# Patient Record
Sex: Female | Born: 1955 | Race: White | Hispanic: No | Marital: Married | Smoking: Former smoker
Health system: Southern US, Community
[De-identification: ages and names within clinical notes are randomized; demographics above are authoritative.]

## PROBLEM LIST (undated history)

## (undated) DIAGNOSIS — T8859XA Other complications of anesthesia, initial encounter: Secondary | ICD-10-CM

## (undated) DIAGNOSIS — M1711 Unilateral primary osteoarthritis, right knee: Secondary | ICD-10-CM

## (undated) DIAGNOSIS — J302 Other seasonal allergic rhinitis: Secondary | ICD-10-CM

## (undated) DIAGNOSIS — T4145XA Adverse effect of unspecified anesthetic, initial encounter: Secondary | ICD-10-CM

## (undated) DIAGNOSIS — J45909 Unspecified asthma, uncomplicated: Secondary | ICD-10-CM

## (undated) HISTORY — PX: TUBAL LIGATION: SHX77

## (undated) HISTORY — PX: OTHER SURGICAL HISTORY: SHX169

## (undated) HISTORY — PX: DIAGNOSTIC LAPAROSCOPY: SUR761

## (undated) HISTORY — PX: MULTIPLE TOOTH EXTRACTIONS: SHX2053

---

## 2002-01-22 HISTORY — PX: ESOPHAGOGASTRODUODENOSCOPY: SHX1529

## 2002-12-07 ENCOUNTER — Ambulatory Visit (HOSPITAL_COMMUNITY): Admission: RE | Admit: 2002-12-07 | Discharge: 2002-12-07 | Payer: Self-pay | Admitting: Family Medicine

## 2002-12-14 ENCOUNTER — Ambulatory Visit (HOSPITAL_COMMUNITY): Admission: RE | Admit: 2002-12-14 | Discharge: 2002-12-14 | Payer: Self-pay | Admitting: Internal Medicine

## 2002-12-22 ENCOUNTER — Ambulatory Visit (HOSPITAL_COMMUNITY): Admission: RE | Admit: 2002-12-22 | Discharge: 2002-12-22 | Payer: Self-pay | Admitting: Internal Medicine

## 2006-04-15 ENCOUNTER — Ambulatory Visit (HOSPITAL_COMMUNITY): Admission: RE | Admit: 2006-04-15 | Discharge: 2006-04-15 | Payer: Self-pay | Admitting: Family Medicine

## 2006-04-26 ENCOUNTER — Encounter: Admission: RE | Admit: 2006-04-26 | Discharge: 2006-04-26 | Payer: Self-pay | Admitting: Family Medicine

## 2007-02-19 ENCOUNTER — Ambulatory Visit (HOSPITAL_COMMUNITY): Admission: RE | Admit: 2007-02-19 | Discharge: 2007-02-19 | Payer: Self-pay | Admitting: Family Medicine

## 2007-12-04 ENCOUNTER — Ambulatory Visit (HOSPITAL_COMMUNITY): Admission: RE | Admit: 2007-12-04 | Discharge: 2007-12-04 | Payer: Self-pay | Admitting: Family Medicine

## 2008-02-17 ENCOUNTER — Emergency Department (HOSPITAL_COMMUNITY): Admission: EM | Admit: 2008-02-17 | Discharge: 2008-02-18 | Payer: Self-pay | Admitting: Emergency Medicine

## 2008-02-26 ENCOUNTER — Ambulatory Visit (HOSPITAL_COMMUNITY): Admission: RE | Admit: 2008-02-26 | Discharge: 2008-02-26 | Payer: Self-pay | Admitting: Family Medicine

## 2010-02-12 ENCOUNTER — Encounter: Payer: Self-pay | Admitting: Family Medicine

## 2010-06-09 NOTE — Procedures (Signed)
NAMETISH, BEGIN                ACCOUNT NO.:  [id number]   MEDICAL RECORD NO.:  [id number]          PATIENT TYPE:  OUT   LOCATION:  RESP                          FACILITY:  [facility]   PHYSICIAN:  Edward L. Juanetta Gosling, M.D.DATE OF BIRTH:  27-Mar-1955   DATE OF PROCEDURE:  DATE OF DISCHARGE:                            PULMONARY FUNCTION TEST   PULMONARY FUNCTION TEST:  1. Spirometry shows no ventilatory defect in the volumes and      capacities are actually supranormal.  There is probably some      airflow obstruction at the level of the smaller airways.  2. There is no bronchodilator improvement.      Edward L. Juanetta Gosling, M.D.  Electronically Signed     ELH/MEDQ  D:  02/27/2008  T:  02/27/2008  Job:  [id number]   cc:   Lorin Picket A. Gerda Diss, MD  Fax: [phone]

## 2010-06-09 NOTE — Op Note (Signed)
NAME:  Danielle Spears, Danielle Spears                           ACCOUNT NO.:  [id number]   MEDICAL RECORD NO.:  [id number]                   PATIENT TYPE:  AMB   LOCATION:  DAY                                  FACILITY:  [facility]   PHYSICIAN:  Lionel December, M.D.                 DATE OF BIRTH:  19-Mar-1955   DATE OF PROCEDURE:  12/14/2002  DATE OF DISCHARGE:                                 OPERATIVE REPORT   PROCEDURE:  Esophagogastroduodenoscopy with esophageal dilatation.   ENDOSCOPIST:  Lionel December, M.D.   INDICATIONS:  This patient is a 55 year old Caucasian female who presents  with recent onset of intractable heartburn and dysphagia as well as some  odynophagia. She has been on Prevacid, this is her second week, but does not  feel much better.  There is no history of recent antibiotic use.  The  procedure and risks were reviewed with the patient and informed consent was  obtained.   PREOPERATIVE MEDICATIONS:  Cetacaine spray for oropharyngeal topical  anesthesia, Demerol 50 mg IV and Versed 6 mg IV in divided dose.   FINDINGS:  Procedure performed in endoscopy suite.  The patient's vital  signs and O2 saturation were monitored during the procedure and remained  stable.  The patient was placed in the left lateral recumbent position and  Olympus videoscope was passed via the oropharynx without any difficulty into  the esophagus.   ESOPHAGUS:  Mucosa of the esophagus was normal throughout.  Squamocolumnar  junction was somewhat wavy but there was no ring or stricture noted, so did  not see any hernia.   STOMACH:  It was empty and distended very well with insufflation.  The folds  of the proximal stomach were normal.  Examination of the mucosa revealed  patchy red areas at the body.  The mucosa of the pyloric channel was covered  with blood; however, there was no ulcer underneath.  The pyloric channel was  patent.  The angularis, fundus, and cardia were examined by retroflexing the  scope and  were normal.   DUODENUM:  Examination of the bulb and second part of the duodenum was  normal.  The endoscope was withdrawn.   The esophagus dilated by passing 56 French Maloney dilator through the  esophagus completely.  As the dilatator was withdrawn, the endoscope was  passed again; and there was no mucosal injury noted to esophagus.   The endoscope was withdrawn.  The patient tolerated the procedure well.   FINAL DIAGNOSES:  1. No endoscopic evidence of reflux esophagitis, stricture or ring     formation.  2. Esophagus dilated by passing 56 French Maloney dilator given history of     dysphagia.  3. Gastritic and pyloric channel inflammation.   RECOMMENDATIONS:  1. She will continue antireflux measures on a PPI.  2. H. pylori serology will be checked today.  If she remains with dysphagia  will bring her back for barium study with a pill prior to considering     esophageal manometry.      ___________________________________________                                            Lionel December, M.D.   NR/MEDQ  D:  12/14/2002  T:  12/14/2002  Job:  [id number]   cc:   Lorin Picket A. Gerda Diss, M.D.  [address]  [place]  [place] [address]  Fax: [phone]

## 2012-02-14 ENCOUNTER — Telehealth: Payer: Self-pay | Admitting: Urgent Care

## 2012-02-14 NOTE — Telephone Encounter (Signed)
Pt called this afternoon. She said that her PCP (Luking) told her to call to set up her tcs. Please call her at [phone]

## 2012-02-15 NOTE — Telephone Encounter (Signed)
LMOM to call.

## 2012-02-19 NOTE — Telephone Encounter (Signed)
LMOM to call.

## 2012-02-25 ENCOUNTER — Other Ambulatory Visit: Payer: Self-pay

## 2012-02-25 ENCOUNTER — Other Ambulatory Visit: Payer: Self-pay | Admitting: Family Medicine

## 2012-02-25 ENCOUNTER — Telehealth: Payer: Self-pay

## 2012-02-25 ENCOUNTER — Encounter (HOSPITAL_COMMUNITY): Payer: Self-pay | Admitting: Pharmacy Technician

## 2012-02-25 DIAGNOSIS — Z139 Encounter for screening, unspecified: Secondary | ICD-10-CM

## 2012-02-25 MED ORDER — PEG-KCL-NACL-NASULF-NA ASC-C 100 G PO SOLR: 1.0000 | ORAL | Status: DC

## 2012-02-25 NOTE — Telephone Encounter (Signed)
Gastroenterology Pre-Procedure Form    Request Date: 02/25/2012    Requesting Physician: Dr. Lilyan Punt     PATIENT INFORMATION:  Danielle Spears is a 57 y.o., female (DOB=March 31, 1955).  PROCEDURE: Procedure(s) requested: colonoscopy Procedure Reason: screening for colon cancer  PATIENT REVIEW QUESTIONS: The patient reports the following:   1. Diabetes Melitis: no 2. Joint replacements in the past 12 months: no 3. Major health problems in the past 3 months: no 4. Has an artificial valve or MVP:no 5. Has been advised in past to take antibiotics in advance of a procedure like teeth cleaning: no}    MEDICATIONS & ALLERGIES:    Patient reports the following regarding taking any blood thinners:   Plavix? no Aspirin?no Coumadin?  no  Patient confirms/reports the following medications:  Current Outpatient Prescriptions  Medication Sig Dispense Refill  . cetirizine (ZYRTEC) 10 MG tablet Take 10 mg by mouth daily.      . Fluticasone-Salmeterol (ADVAIR) 250-50 MCG/DOSE AEPB Inhale 1 puff into the lungs every 12 (twelve) hours.        Patient confirms/reports the following allergies:  No Known Allergies  Patient is appropriate to schedule for requested procedure(s): yes  AUTHORIZATION INFORMATION Primary Insurance:  ID #: Group #:  Pre-Cert / Auth required:  Pre-Cert / Auth #:   Secondary Insurance:   ID #:  Group #:  Pre-Cert / Auth required:  Pre-Cert / Auth #:   No orders of the defined types were placed in this encounter.    SCHEDULE INFORMATION: Procedure has been scheduled as follows:  Date: 03/03/2012    Time: 11:30 AM  Location: [facility] Short Stay  This Gastroenterology Pre-Precedure Form is being routed to the following provider(s) for review: R. Roetta Sessions, MD

## 2012-02-25 NOTE — Telephone Encounter (Signed)
Returned pt's call to schedule screening colonoscopy. LMOM to call.

## 2012-02-25 NOTE — Telephone Encounter (Signed)
See separate triage.  

## 2012-02-25 NOTE — Telephone Encounter (Signed)
Rx sent to [facility] in [place]. Instructions mailed to pt.

## 2012-02-25 NOTE — Telephone Encounter (Signed)
Appropriate to schedule.  

## 2012-02-27 ENCOUNTER — Telehealth: Payer: Self-pay

## 2012-02-27 NOTE — Telephone Encounter (Signed)
I called [employer] at [phone] and spoke to Darlisa C, who said that a PA is not required for a screening colonoscopy.

## 2012-03-03 ENCOUNTER — Ambulatory Visit (HOSPITAL_COMMUNITY)
Admission: RE | Admit: 2012-03-03 | Discharge: 2012-03-03 | Disposition: A | Discharge status: Home / Self Care | Payer: Managed Care, Other (non HMO) | Source: Ambulatory Visit | Attending: Internal Medicine | Admitting: Internal Medicine

## 2012-03-03 ENCOUNTER — Encounter (HOSPITAL_COMMUNITY)
Admission: RE | Disposition: A | Discharge status: Home / Self Care | Payer: Self-pay | Source: Ambulatory Visit | Attending: Internal Medicine

## 2012-03-03 ENCOUNTER — Encounter (HOSPITAL_COMMUNITY): Payer: Self-pay | Admitting: *Deleted

## 2012-03-03 DIAGNOSIS — Z1211 Encounter for screening for malignant neoplasm of colon: Secondary | ICD-10-CM

## 2012-03-03 DIAGNOSIS — Z139 Encounter for screening, unspecified: Secondary | ICD-10-CM

## 2012-03-03 DIAGNOSIS — K6389 Other specified diseases of intestine: Secondary | ICD-10-CM

## 2012-03-03 HISTORY — PX: COLONOSCOPY: SHX5424

## 2012-03-03 SURGERY — COLONOSCOPY
Anesthesia: Moderate Sedation

## 2012-03-03 MED ORDER — MEPERIDINE HCL 100 MG/ML IJ SOLN
INTRAMUSCULAR | Status: DC | PRN
  Administered 2012-03-03: 25 mg via INTRAVENOUS
  Administered 2012-03-03: 50 mg via INTRAVENOUS
  Administered 2012-03-03: 25 mg via INTRAVENOUS

## 2012-03-03 MED ORDER — ONDANSETRON HCL 4 MG/2ML IJ SOLN
INTRAMUSCULAR | Status: AC
  Filled 2012-03-03: qty 2

## 2012-03-03 MED ORDER — STERILE WATER FOR IRRIGATION IR SOLN
Status: DC | PRN
  Administered 2012-03-03: 11:00:00

## 2012-03-03 MED ORDER — MIDAZOLAM HCL 5 MG/5ML IJ SOLN
INTRAMUSCULAR | Status: AC
  Filled 2012-03-03: qty 10

## 2012-03-03 MED ORDER — MEPERIDINE HCL 100 MG/ML IJ SOLN
INTRAMUSCULAR | Status: AC
  Filled 2012-03-03: qty 1

## 2012-03-03 MED ORDER — SODIUM CHLORIDE 0.45 % IV SOLN
INTRAVENOUS | Status: DC
  Administered 2012-03-03: 1000 mL via INTRAVENOUS

## 2012-03-03 MED ORDER — MIDAZOLAM HCL 5 MG/5ML IJ SOLN
INTRAMUSCULAR | Status: DC | PRN
  Administered 2012-03-03: 1 mg via INTRAVENOUS
  Administered 2012-03-03 (×2): 2 mg via INTRAVENOUS

## 2012-03-03 MED ORDER — ONDANSETRON HCL 4 MG/2ML IJ SOLN
INTRAMUSCULAR | Status: DC | PRN
  Administered 2012-03-03: 4 mg via INTRAVENOUS

## 2012-03-03 NOTE — H&P (Signed)
  Primary Care Physician:  Lilyan Punt, MD Primary Gastroenterologist:  Dr. Jena Gauss  Pre-Procedure History & Physical: HPI:  Danielle Spears is a 57 y.o. female is here for a screening colonoscopy. First-ever examination. No bowel symptoms. No family history colon cancer.  History reviewed. No pertinent past medical history.  Past Surgical History  Procedure Laterality Date  . Cesarean section      Prior to Admission medications   Medication Sig Start Date End Date Taking? Authorizing Provider  cetirizine (ZYRTEC) 10 MG tablet Take 10 mg by mouth daily.   Yes Historical Provider, MD  Fluticasone-Salmeterol (ADVAIR) 250-50 MCG/DOSE AEPB Inhale 1 puff into the lungs every 12 (twelve) hours.   Yes Historical Provider, MD  peg 3350 powder (MOVIPREP) 100 G SOLR Take 1 kit (100 g total) by mouth as directed. 02/25/12  Yes Corbin Ade, MD    Allergies as of 02/25/2012  . (No Known Allergies)    History reviewed. No pertinent family history.  History   Social History  . Marital Status: Married    Spouse Name: N/A    Number of Children: N/A  . Years of Education: N/A   Occupational History  . Not on file.   Social History Main Topics  . Smoking status: Former Games developer  . Smokeless tobacco: Not on file  . Alcohol Use: Not on file  . Drug Use: Not on file  . Sexually Active: Not on file   Other Topics Concern  . Not on file   Social History Narrative  . No narrative on file    Review of Systems: See HPI, otherwise negative ROS  Physical Exam: BP 136/55  Pulse 54  Temp(Src) 97.5 F (36.4 C) (Oral)  Resp 19  Ht 5' 2.5" (1.588 m)  Wt 170 lb (77.111 kg)  BMI 30.58 kg/m2  SpO2 100% General:   Alert,  Well-developed, well-nourished, pleasant and cooperative in NAD Head:  Normocephalic and atraumatic. Eyes:  Sclera clear, no icterus.   Conjunctiva pink. Ears:  Normal auditory acuity. Nose:  No deformity, discharge,  or lesions. Mouth:  No deformity or lesions,  dentition normal. Neck:  Supple; no masses or thyromegaly. Lungs:  Clear throughout to auscultation.   No wheezes, crackles, or rhonchi. No acute distress. Heart:  Regular rate and rhythm; no murmurs, clicks, rubs,  or gallops. Abdomen:  Soft, nontender and nondistended. No masses, hepatosplenomegaly or hernias noted. Normal bowel sounds, without guarding, and without rebound.   Msk:  Symmetrical without gross deformities. Normal posture. Pulses:  Normal pulses noted. Extremities:  Without clubbing or edema. Neurologic:  Alert and  oriented x4;  grossly normal neurologically. Skin:  Intact without significant lesions or rashes. Cervical Nodes:  No significant cervical adenopathy. Psych:  Alert and cooperative. Normal mood and affect.  Impression/Plan: Danielle Spears is now here to undergo a screening colonoscopy.  Average risk screening examination. Risks, benefits, limitations, imponderables and alternatives regarding colonoscopy have been reviewed with the patient. Questions have been answered. All parties agreeable.

## 2012-03-03 NOTE — Op Note (Signed)
[facility] [address] [place], [address]   COLONOSCOPY PROCEDURE REPORT  PATIENT: Danielle, Spears  MR#:         [id number] BIRTHDATE: 1955/03/20 , 56  yrs. old GENDER: Female ENDOSCOPIST: R.  Roetta Sessions, MD FACP FACG REFERRED BY:  Lilyan Punt, M.D. PROCEDURE DATE:  03/03/2012 PROCEDURE:     First ever screening colonoscopy  INDICATIONS: Average risk colorectal cancer screening  INFORMED CONSENT:  The risks, benefits, alternatives and imponderables including but not limited to bleeding, perforation as well as the possibility of a missed lesion have been reviewed.  The potential for biopsy, lesion removal, etc. have also been discussed.  Questions have been answered.  All parties agreeable. Please see the history and physical in the medical record for more information.  MEDICATIONS: Versed 5 mg IV and Demerol 100 mg IV in divided doses. Zofran 4 mg IV  DESCRIPTION OF PROCEDURE:  After a digital rectal exam was performed, the Pentax Colonoscope [id number]  colonoscope was advanced from the anus through the rectum and colon to the area of the cecum, ileocecal valve and appendiceal orifice.  The cecum was deeply intubated.  These structures were well-seen and photographed for the record.  From the level of the cecum and ileocecal valve, the scope was slowly and cautiously withdrawn.  The mucosal surfaces were carefully surveyed utilizing scope tip deflection to facilitate fold flattening as needed.  The scope was pulled down into the rectum where a thorough examination including retroflexion was performed.    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 Otherwise, the rectal mucosa appeared normal. Likewise, diffusely pigmented colonic mucosa consistent with melanosis  coli; however, the colonic mucosa otherwise appeared normal. The distal 10 cm of terminal ileal mucosa also appeared  normal  THERAPEUTIC / DIAGNOSTIC MANEUVERS PERFORMED:  none  COMPLICATIONS: none  CECAL WITHDRAWAL TIME:  9 minutes  IMPRESSION:  Melanosis coli  RECOMMENDATIONS: Repeat screening colonoscopy in 10 years   _______________________________ eSigned:  R. Roetta Sessions, MD FACP [facility] 03/03/2012 11:34 AM   CC:    PATIENT NAME:  Danielle, Spears MR#: [id number]

## 2012-03-04 ENCOUNTER — Ambulatory Visit (HOSPITAL_COMMUNITY)
Admission: RE | Admit: 2012-03-04 | Discharge: 2012-03-04 | Disposition: A | Discharge status: Home / Self Care | Payer: Managed Care, Other (non HMO) | Source: Ambulatory Visit | Attending: Family Medicine | Admitting: Family Medicine

## 2012-03-04 DIAGNOSIS — Z1231 Encounter for screening mammogram for malignant neoplasm of breast: Secondary | ICD-10-CM | POA: Insufficient documentation

## 2012-03-04 DIAGNOSIS — Z139 Encounter for screening, unspecified: Secondary | ICD-10-CM

## 2012-03-06 ENCOUNTER — Encounter (HOSPITAL_COMMUNITY): Payer: Self-pay | Admitting: Internal Medicine

## 2012-04-19 ENCOUNTER — Emergency Department (HOSPITAL_COMMUNITY): Payer: Managed Care, Other (non HMO)

## 2012-04-19 ENCOUNTER — Emergency Department (HOSPITAL_COMMUNITY)
Admission: EM | Admit: 2012-04-19 | Discharge: 2012-04-19 | Disposition: A | Discharge status: Home / Self Care | Payer: Managed Care, Other (non HMO) | Attending: Emergency Medicine | Admitting: Emergency Medicine

## 2012-04-19 ENCOUNTER — Encounter (HOSPITAL_COMMUNITY): Payer: Self-pay | Admitting: *Deleted

## 2012-04-19 DIAGNOSIS — S83146A Lateral dislocation of proximal end of tibia, unspecified knee, initial encounter: Secondary | ICD-10-CM | POA: Insufficient documentation

## 2012-04-19 DIAGNOSIS — J45909 Unspecified asthma, uncomplicated: Secondary | ICD-10-CM | POA: Insufficient documentation

## 2012-04-19 DIAGNOSIS — Z87891 Personal history of nicotine dependence: Secondary | ICD-10-CM | POA: Insufficient documentation

## 2012-04-19 DIAGNOSIS — Y929 Unspecified place or not applicable: Secondary | ICD-10-CM | POA: Insufficient documentation

## 2012-04-19 DIAGNOSIS — Y939 Activity, unspecified: Secondary | ICD-10-CM | POA: Insufficient documentation

## 2012-04-19 DIAGNOSIS — X58XXXA Exposure to other specified factors, initial encounter: Secondary | ICD-10-CM | POA: Insufficient documentation

## 2012-04-19 DIAGNOSIS — S83101A Unspecified subluxation of right knee, initial encounter: Secondary | ICD-10-CM

## 2012-04-19 DIAGNOSIS — IMO0002 Reserved for concepts with insufficient information to code with codable children: Secondary | ICD-10-CM | POA: Insufficient documentation

## 2012-04-19 HISTORY — DX: Unspecified asthma, uncomplicated: J45.909

## 2012-04-19 MED ORDER — OXYCODONE-ACETAMINOPHEN 5-325 MG PO TABS: 1.0000 | ORAL_TABLET | ORAL | Status: DC | PRN

## 2012-04-19 MED ORDER — OXYCODONE-ACETAMINOPHEN 5-325 MG PO TABS
1.0000 | ORAL_TABLET | Freq: Once | ORAL | Status: AC
  Administered 2012-04-19: 1 via ORAL
  Filled 2012-04-19: qty 1

## 2012-04-19 NOTE — ED Provider Notes (Signed)
History     CSN: [id number]  Arrival date & time 04/19/12  1624   First MD Initiated Contact with Patient 04/19/12 1702      Chief Complaint  Patient presents with  . Knee Pain    (Consider location/radiation/quality/duration/timing/severity/associated sxs/prior treatment) HPI Danielle Spears is a 57 y.o. female who presents to the ED with knee pain. The pain is located in the right knee in the medial aspect. The pain is worse with weight bearing. Nothing makes the pain better. The pain started last night. The onset was sudden. Associated symptoms include swelling. There was no known injury. The history was provided by the patient.  Past Medical History  Diagnosis Date  . Asthma     Past Surgical History  Procedure Laterality Date  . Cesarean section    . Colonoscopy N/A 03/03/2012    Procedure: COLONOSCOPY;  Surgeon: Corbin Ade, MD;  Location: [facility] ENDO SUITE;  Service: Endoscopy;  Laterality: N/A;  11:30 AM    No family history on file.  History  Substance Use Topics  . Smoking status: Former Games developer  . Smokeless tobacco: Not on file  . Alcohol Use: Not on file    OB History   Grav Para Term Preterm Abortions TAB SAB Ect Mult Living                  Review of Systems  Constitutional: Negative for activity change.  Respiratory: Negative for chest tightness.   Cardiovascular: Negative for chest pain.  Gastrointestinal: Negative for nausea and vomiting.  Musculoskeletal: Negative for back pain.       Right knee pain  Skin: Negative for wound.  Allergic/Immunologic: Negative for immunocompromised state.  Neurological: Negative for dizziness and headaches.  Psychiatric/Behavioral: Negative for confusion. The patient is not nervous/anxious.     Allergies  Review of patient's allergies indicates no known allergies.  Home Medications   Current Outpatient Rx  Name  Route  Sig  Dispense  Refill  . Fluticasone-Salmeterol (ADVAIR) 250-50 MCG/DOSE AEPB  Inhalation   Inhale 1 puff into the lungs every 12 (twelve) hours.           BP 129/82  Pulse 52  Temp(Src) 98.3 F (36.8 C) (Oral)  Resp 20  Ht 5\' 2"  (1.575 m)  Wt 160 lb (72.576 kg)  BMI 29.26 kg/m2  SpO2 100%  Physical Exam  Nursing note and vitals reviewed. Constitutional: She is oriented to person, place, and time. She appears well-developed and well-nourished. No distress.  HENT:  Head: Normocephalic and atraumatic.  Eyes: EOM are normal. Pupils are equal, round, and reactive to light.  Neck: Neck supple.  Cardiovascular: Normal rate.   Pulmonary/Chest: Effort normal.  Musculoskeletal: She exhibits no edema.       Right knee: She exhibits decreased range of motion and swelling. She exhibits no ecchymosis, no erythema and normal patellar mobility. Tenderness found. MCL tenderness noted.  Neurological: She is alert and oriented to person, place, and time. She has normal strength. No cranial nerve deficit or sensory deficit.  Pedal pulses equal bilateral. Adequate circulation. Good touch sensation.  Skin: Skin is warm and dry.  Psychiatric: She has a normal mood and affect. Her behavior is normal. Judgment and thought content normal.    ED Course  Procedures (including critical care time)  Labs Reviewed - No data to display Dg Knee Complete 4 Views Right  04/19/2012  *RADIOLOGY REPORT*  Clinical Data: Right knee pain around kneecap.  RIGHT  KNEE - COMPLETE 4+ VIEW  Comparison: None.  Findings: Four views of the knee demonstrate chondrocalcinosis. There is a small suprapatellar joint effusion.  There appears to be lateral subluxation of the knee joint.  Mild degenerative changes involving the medial and lateral knee compartment. No evidence for an acute fracture.  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  Small suprapatellar joint effusion.   Original Report Authenticated By: Richarda Overlie, M.D.    Assessment: 57 y.o. female with  right knee pain   DJD, small suprapatellar joint effusion, some lateral subluxation  Plan:  Consult with ortho Dr. Luiz Blare   Knee immobilizer   Pain management    Follow up in the ortho office    MDM  Dr. Juleen China in to examine the patient and spoke with Dr. Luiz Blare regarding the x-ray findings. Patient should be stable to follow up in the office. She will be placed in a knee immobilizer and crutches prior to discharge. Dr. Juleen China discussed plan of care with the patient and she voices understanding.      Medication List    TAKE these medications       oxyCODONE-acetaminophen 5-325 MG per tablet  Commonly known as:  PERCOCET/ROXICET  Take 1 tablet by mouth every 4 (four) hours as needed for pain.      ASK your doctor about these medications       Fluticasone-Salmeterol 250-50 MCG/DOSE Aepb  Commonly known as:  ADVAIR  Inhale 1 puff into the lungs every 12 (twelve) hours.             [facility] Orlene Och, [place] 04/19/12 1859

## 2012-04-19 NOTE — ED Notes (Signed)
Pt c/o right knee pain that started last night, swelling noted to right knee, denies any injury,

## 2012-04-23 NOTE — ED Provider Notes (Signed)
Medical screening examination/treatment/procedure(s) were performed by non-physician practitioner and as supervising physician I was immediately available for consultation/collaboration.  Raeford Razor, MD 04/23/12 1919

## 2012-04-30 ENCOUNTER — Other Ambulatory Visit: Payer: Self-pay | Admitting: Orthopedic Surgery

## 2012-04-30 ENCOUNTER — Encounter (HOSPITAL_BASED_OUTPATIENT_CLINIC_OR_DEPARTMENT_OTHER): Payer: Self-pay | Admitting: *Deleted

## 2012-04-30 NOTE — Progress Notes (Signed)
asthma controlled-no other health issues

## 2012-05-02 ENCOUNTER — Encounter (HOSPITAL_BASED_OUTPATIENT_CLINIC_OR_DEPARTMENT_OTHER)
Admission: RE | Disposition: A | Discharge status: Home / Self Care | Payer: Self-pay | Source: Ambulatory Visit | Attending: Orthopedic Surgery

## 2012-05-02 ENCOUNTER — Ambulatory Visit (HOSPITAL_BASED_OUTPATIENT_CLINIC_OR_DEPARTMENT_OTHER): Payer: Managed Care, Other (non HMO) | Admitting: Certified Registered Nurse Anesthetist

## 2012-05-02 ENCOUNTER — Encounter (HOSPITAL_BASED_OUTPATIENT_CLINIC_OR_DEPARTMENT_OTHER): Payer: Self-pay | Admitting: *Deleted

## 2012-05-02 ENCOUNTER — Ambulatory Visit (HOSPITAL_BASED_OUTPATIENT_CLINIC_OR_DEPARTMENT_OTHER)
Admission: RE | Admit: 2012-05-02 | Discharge: 2012-05-02 | Disposition: A | Discharge status: Home / Self Care | Payer: Managed Care, Other (non HMO) | Source: Ambulatory Visit | Attending: Orthopedic Surgery | Admitting: Orthopedic Surgery

## 2012-05-02 ENCOUNTER — Encounter (HOSPITAL_BASED_OUTPATIENT_CLINIC_OR_DEPARTMENT_OTHER): Payer: Self-pay | Admitting: Certified Registered Nurse Anesthetist

## 2012-05-02 DIAGNOSIS — Z87891 Personal history of nicotine dependence: Secondary | ICD-10-CM | POA: Insufficient documentation

## 2012-05-02 DIAGNOSIS — X58XXXA Exposure to other specified factors, initial encounter: Secondary | ICD-10-CM | POA: Insufficient documentation

## 2012-05-02 DIAGNOSIS — J45909 Unspecified asthma, uncomplicated: Secondary | ICD-10-CM | POA: Insufficient documentation

## 2012-05-02 DIAGNOSIS — S83289A Other tear of lateral meniscus, current injury, unspecified knee, initial encounter: Secondary | ICD-10-CM | POA: Insufficient documentation

## 2012-05-02 DIAGNOSIS — Z79899 Other long term (current) drug therapy: Secondary | ICD-10-CM | POA: Insufficient documentation

## 2012-05-02 DIAGNOSIS — M2240 Chondromalacia patellae, unspecified knee: Secondary | ICD-10-CM | POA: Insufficient documentation

## 2012-05-02 DIAGNOSIS — M9420 Chondromalacia, unspecified site: Secondary | ICD-10-CM | POA: Insufficient documentation

## 2012-05-02 DIAGNOSIS — IMO0002 Reserved for concepts with insufficient information to code with codable children: Secondary | ICD-10-CM | POA: Insufficient documentation

## 2012-05-02 HISTORY — DX: Other seasonal allergic rhinitis: J30.2

## 2012-05-02 HISTORY — PX: KNEE ARTHROSCOPY WITH MEDIAL MENISECTOMY: SHX5651

## 2012-05-02 SURGERY — ARTHROSCOPY, KNEE, WITH MEDIAL MENISCECTOMY
Anesthesia: General | Site: Knee | Laterality: Right | Wound class: Clean

## 2012-05-02 MED ORDER — EPINEPHRINE HCL 1 MG/ML IJ SOLN
INTRAMUSCULAR | Status: DC | PRN
  Administered 2012-05-02: 1 mg

## 2012-05-02 MED ORDER — DEXAMETHASONE SODIUM PHOSPHATE 4 MG/ML IJ SOLN
INTRAMUSCULAR | Status: DC | PRN
  Administered 2012-05-02: 4 mg via INTRAVENOUS

## 2012-05-02 MED ORDER — ATROPINE SULFATE 0.4 MG/ML IJ SOLN
INTRAMUSCULAR | Status: DC | PRN
  Administered 2012-05-02 (×2): .1 mg via INTRAVENOUS

## 2012-05-02 MED ORDER — LACTATED RINGERS IV SOLN
INTRAVENOUS | Status: DC
  Administered 2012-05-02 (×2): via INTRAVENOUS

## 2012-05-02 MED ORDER — ONDANSETRON HCL 4 MG/2ML IJ SOLN
INTRAMUSCULAR | Status: DC | PRN
  Administered 2012-05-02: 4 mg via INTRAVENOUS

## 2012-05-02 MED ORDER — OXYCODONE-ACETAMINOPHEN 5-325 MG PO TABS: 1.0000 | ORAL_TABLET | Freq: Four times a day (QID) | ORAL | Status: DC | PRN

## 2012-05-02 MED ORDER — POVIDONE-IODINE 7.5 % EX SOLN: Freq: Once | CUTANEOUS | Status: DC

## 2012-05-02 MED ORDER — SODIUM CHLORIDE 0.9 % IR SOLN
Status: DC | PRN
  Administered 2012-05-02: 5000 mL

## 2012-05-02 MED ORDER — LIDOCAINE HCL (CARDIAC) 20 MG/ML IV SOLN
INTRAVENOUS | Status: DC | PRN
  Administered 2012-05-02: 80 mg via INTRAVENOUS

## 2012-05-02 MED ORDER — FENTANYL CITRATE 0.05 MG/ML IJ SOLN
INTRAMUSCULAR | Status: DC | PRN
  Administered 2012-05-02: 25 ug via INTRAVENOUS
  Administered 2012-05-02: 100 ug via INTRAVENOUS
  Administered 2012-05-02: 50 ug via INTRAVENOUS
  Administered 2012-05-02: 25 ug via INTRAVENOUS

## 2012-05-02 MED ORDER — BUPIVACAINE HCL (PF) 0.5 % IJ SOLN
INTRAMUSCULAR | Status: DC | PRN
  Administered 2012-05-02: 20 mL via INTRA_ARTICULAR

## 2012-05-02 MED ORDER — HYDROMORPHONE HCL PF 1 MG/ML IJ SOLN
0.2500 mg | INTRAMUSCULAR | Status: DC | PRN
  Administered 2012-05-02 (×4): 0.5 mg via INTRAVENOUS

## 2012-05-02 MED ORDER — OXYCODONE HCL 5 MG PO TABS
5.0000 mg | ORAL_TABLET | Freq: Once | ORAL | Status: AC | PRN
  Administered 2012-05-02: 5 mg via ORAL

## 2012-05-02 MED ORDER — MIDAZOLAM HCL 5 MG/5ML IJ SOLN
INTRAMUSCULAR | Status: DC | PRN
  Administered 2012-05-02: 2 mg via INTRAVENOUS

## 2012-05-02 MED ORDER — FENTANYL CITRATE 0.05 MG/ML IJ SOLN: 25.0000 ug | INTRAMUSCULAR | Status: DC | PRN

## 2012-05-02 MED ORDER — PROPOFOL 10 MG/ML IV BOLUS
INTRAVENOUS | Status: DC | PRN
  Administered 2012-05-02: 130 mg via INTRAVENOUS
  Administered 2012-05-02: 50 mg via INTRAVENOUS
  Administered 2012-05-02: 70 mg via INTRAVENOUS

## 2012-05-02 MED ORDER — CEFAZOLIN SODIUM-DEXTROSE 2-3 GM-% IV SOLR
2.0000 g | INTRAVENOUS | Status: AC
  Administered 2012-05-02: 2 g via INTRAVENOUS

## 2012-05-02 MED ORDER — FENTANYL CITRATE 0.05 MG/ML IJ SOLN: 50.0000 ug | INTRAMUSCULAR | Status: DC | PRN

## 2012-05-02 MED ORDER — OXYCODONE HCL 5 MG/5ML PO SOLN: 5.0000 mg | Freq: Once | ORAL | Status: AC | PRN

## 2012-05-02 MED ORDER — ONDANSETRON HCL 4 MG/2ML IJ SOLN: 4.0000 mg | Freq: Four times a day (QID) | INTRAMUSCULAR | Status: DC | PRN

## 2012-05-02 MED ORDER — MIDAZOLAM HCL 2 MG/2ML IJ SOLN: 1.0000 mg | INTRAMUSCULAR | Status: DC | PRN

## 2012-05-02 SURGICAL SUPPLY — 39 items
BANDAGE ELASTIC 6 VELCRO ST LF (GAUZE/BANDAGES/DRESSINGS) ×2 IMPLANT
BLADE 4.2CUDA (BLADE) IMPLANT
BLADE GREAT WHITE 4.2 (BLADE) ×2 IMPLANT
CANISTER OMNI JUG 16 LITER (MISCELLANEOUS) IMPLANT
CANISTER SUCTION 2500CC (MISCELLANEOUS) ×2 IMPLANT
CLOTH BEACON ORANGE TIMEOUT ST (SAFETY) ×2 IMPLANT
CUTTER MENISCUS  4.2MM (BLADE)
CUTTER MENISCUS 4.2MM (BLADE) IMPLANT
DRAPE ARTHROSCOPY W/POUCH 114 (DRAPES) ×2 IMPLANT
DRSG EMULSION OIL 3X3 NADH (GAUZE/BANDAGES/DRESSINGS) ×2 IMPLANT
DURAPREP 26ML APPLICATOR (WOUND CARE) ×2 IMPLANT
ELECT MENISCUS 165MM 90D (ELECTRODE) ×2 IMPLANT
ELECT REM PT RETURN 9FT ADLT (ELECTROSURGICAL) ×2
ELECTRODE REM PT RTRN 9FT ADLT (ELECTROSURGICAL) ×1 IMPLANT
GLOVE BIO SURGEON STRL SZ7 (GLOVE) ×2 IMPLANT
GLOVE BIOGEL PI IND STRL 8 (GLOVE) ×2 IMPLANT
GLOVE BIOGEL PI INDICATOR 8 (GLOVE) ×2
GLOVE ECLIPSE 7.5 STRL STRAW (GLOVE) ×6 IMPLANT
GOWN BRE IMP PREV XXLGXLNG (GOWN DISPOSABLE) ×2 IMPLANT
GOWN PREVENTION PLUS XLARGE (GOWN DISPOSABLE) ×4 IMPLANT
GOWN PREVENTION PLUS XXLARGE (GOWN DISPOSABLE) ×2 IMPLANT
HOLDER KNEE FOAM BLUE (MISCELLANEOUS) ×2 IMPLANT
IV NS IRRIG 3000ML ARTHROMATIC (IV SOLUTION) ×4 IMPLANT
KNEE WRAP E Z 3 GEL PACK (MISCELLANEOUS) ×2 IMPLANT
NDL SAFETY ECLIPSE 18X1.5 (NEEDLE) IMPLANT
NEEDLE HYPO 18GX1.5 SHARP (NEEDLE)
PACK ARTHROSCOPY DSU (CUSTOM PROCEDURE TRAY) ×2 IMPLANT
PACK BASIN DAY SURGERY FS (CUSTOM PROCEDURE TRAY) ×2 IMPLANT
PAD CAST 4YDX4 CTTN HI CHSV (CAST SUPPLIES) ×1 IMPLANT
PADDING CAST COTTON 4X4 STRL (CAST SUPPLIES) ×1
PENCIL BUTTON HOLSTER BLD 10FT (ELECTRODE) ×2 IMPLANT
SET ARTHROSCOPY TUBING (MISCELLANEOUS) ×1
SET ARTHROSCOPY TUBING LN (MISCELLANEOUS) ×1 IMPLANT
SPONGE GAUZE 4X4 12PLY (GAUZE/BANDAGES/DRESSINGS) ×2 IMPLANT
SUT ETHILON 4 0 PS 2 18 (SUTURE) IMPLANT
SYR 5ML LL (SYRINGE) ×2 IMPLANT
TOWEL OR 17X24 6PK STRL BLUE (TOWEL DISPOSABLE) ×2 IMPLANT
TOWEL OR NON WOVEN STRL DISP B (DISPOSABLE) ×2 IMPLANT
WATER STERILE IRR 1000ML POUR (IV SOLUTION) ×2 IMPLANT

## 2012-05-02 NOTE — Anesthesia Postprocedure Evaluation (Signed)
Anesthesia Post Note  Patient: Danielle Spears  Procedure(s) Performed: Procedure(s) (LRB): KNEE ARTHROSCOPY WITH PARTIAL MEDIAL AND LATERAL MENISECTOMY AND PATELLA-FEMORAL  CHONDROPLASTY (Right)  Anesthesia type: General  Patient location: PACU  Post pain: Pain level controlled and Adequate analgesia  Post assessment: Post-op Vital signs reviewed, Patient's Cardiovascular Status Stable, Respiratory Function Stable, Patent Airway and Pain level controlled  Last Vitals:  Filed Vitals:   05/02/12 1345  BP: 124/55  Pulse: 59  Temp:   Resp: 11    Post vital signs: Reviewed and stable  Level of consciousness: awake, alert  and oriented  Complications: No apparent anesthesia complications

## 2012-05-02 NOTE — Brief Op Note (Signed)
05/02/2012  1:43 PM  PATIENT:  Danielle Spears  57 y.o. female  PRE-OPERATIVE DIAGNOSIS:  MEDIAL MENISCUS TEAR RIGHT KNEE  POST-OPERATIVE DIAGNOSIS:  MEDIAL AND LATERAL MENISCUS TEARS AND PATELLA-FEMORAL CHONDROMALACIA  RIGHT KNEE  PROCEDURE:  Procedure(s): KNEE ARTHROSCOPY WITH PARTIAL MEDIAL AND LATERAL MENISECTOMY AND PATELLA-FEMORAL  CHONDROPLASTY (Right)  SURGEON:  Surgeon(s) and Role:    * Harvie Junior, MD - Primary  PHYSICIAN ASSISTANT:   ASSISTANTS: bethune   ANESTHESIA:   general  EBL:  Total I/O In: 1400 [I.V.:1400] Out: -   BLOOD ADMINISTERED:none  DRAINS: none   LOCAL MEDICATIONS USED:  MARCAINE     SPECIMEN:  No Specimen  DISPOSITION OF SPECIMEN:  N/A  COUNTS:  YES  TOURNIQUET:  * No tourniquets in log *  DICTATION: .Other Dictation: Dictation Number [id number]  PLAN OF CARE: Discharge to home after PACU  PATIENT DISPOSITION:  PACU - hemodynamically stable.   Delay start of Pharmacological VTE agent (>24hrs) due to surgical blood loss or risk of bleeding: no

## 2012-05-02 NOTE — H&P (Signed)
  PREOPERATIVE H&P  Chief Complaint: r knee pain  HPI: Danielle Spears is a 57 y.o. female who presents for evaluation of r knee pain. It has been present for greater than 6 months and has been worsening. She has failed conservative measures. Pain is rated as moderate.  Past Medical History  Diagnosis Date  . Asthma   . Seasonal allergies    Past Surgical History  Procedure Laterality Date  . Cesarean section    . Colonoscopy N/A 03/03/2012    Procedure: COLONOSCOPY;  Surgeon: Corbin Ade, MD;  Location: [facility];  Service: Endoscopy;  Laterality: N/A;  11:30 AM   History   Social History  . Marital Status: Married    Spouse Name: N/A    Number of Children: N/A  . Years of Education: N/A   Social History Main Topics  . Smoking status: Former Smoker    Quit date: 05/01/1999  . Smokeless tobacco: None  . Alcohol Use: No  . Drug Use: None  . Sexually Active: None   Other Topics Concern  . None   Social History Narrative  . None   History reviewed. No pertinent family history. No Known Allergies Prior to Admission medications   Medication Sig Start Date End Date Taking? Authorizing Provider  cetirizine (ZYRTEC) 10 MG tablet Take 10 mg by mouth daily.   Yes Historical Provider, MD  Fluticasone-Salmeterol (ADVAIR) 250-50 MCG/DOSE AEPB Inhale 1 puff into the lungs every 12 (twelve) hours.   Yes Historical Provider, MD  Multiple Vitamins-Minerals (MULTIVITAMIN WITH MINERALS) tablet Take 1 tablet by mouth daily.   Yes Historical Provider, MD  oxyCODONE-acetaminophen (PERCOCET/ROXICET) 5-325 MG per tablet Take 1 tablet by mouth every 4 (four) hours as needed for pain. 04/19/12  Yes Hope Orlene Och, NP  vitamin C (ASCORBIC ACID) 500 MG tablet Take 500 mg by mouth daily.   Yes Historical Provider, MD     Positive ROS: none  All other systems have been reviewed and were otherwise negative with the exception of those mentioned in the HPI and as above.  Physical  Exam: Filed Vitals:   05/02/12 0948  BP: 114/76  Pulse: 60  Temp: 97.6 F (36.4 C)  Resp: 16    General: Alert, no acute distress Cardiovascular: No pedal edema Respiratory: No cyanosis, no use of accessory musculature GI: No organomegaly, abdomen is soft and non-tender Skin: No lesions in the area of chief complaint Neurologic: Sensation intact distally Psychiatric: Patient is competent for consent with normal mood and affect Lymphatic: No axillary or cervical lymphadenopathy  MUSCULOSKELETAL: r knee: painful medial side// -instability  Assessment/Plan: MEDIAL MENISCUS TEAR RIGHT KNEE Plan for Procedure(s): KNEE ARTHROSCOPY WITH MEDIAL MENISECTOMY  The risks benefits and alternatives were discussed with the patient including but not limited to the risks of nonoperative treatment, versus surgical intervention including infection, bleeding, nerve injury, malunion, nonunion, hardware prominence, hardware failure, need for hardware removal, blood clots, cardiopulmonary complications, morbidity, mortality, among others, and they were willing to proceed.  Predicted outcome is good, although there will be at least a six to nine month expected recovery.  Tyniah Kastens L, MD 05/02/2012 11:55 AM

## 2012-05-02 NOTE — Anesthesia Preprocedure Evaluation (Signed)
Anesthesia Evaluation  Patient identified by MRN, date of birth, ID band Patient awake    Reviewed: Allergy & Precautions, H&P , NPO status , Patient's Chart, lab work & pertinent test results  Airway Mallampati: II  Neck ROM: full    Dental   Pulmonary asthma ,          Cardiovascular     Neuro/Psych    GI/Hepatic   Endo/Other    Renal/GU      Musculoskeletal   Abdominal   Peds  Hematology   Anesthesia Other Findings   Reproductive/Obstetrics                           Anesthesia Physical Anesthesia Plan  ASA: II  Anesthesia Plan: General   Post-op Pain Management:    Induction: Intravenous  Airway Management Planned: LMA  Additional Equipment:   Intra-op Plan:   Post-operative Plan:   Informed Consent: I have reviewed the patients History and Physical, chart, labs and discussed the procedure including the risks, benefits and alternatives for the proposed anesthesia with the patient or authorized representative who has indicated his/her understanding and acceptance.     Plan Discussed with: CRNA and Surgeon  Anesthesia Plan Comments:         Anesthesia Quick Evaluation

## 2012-05-02 NOTE — Anesthesia Procedure Notes (Signed)
Procedure Name: LMA Insertion Date/Time: 05/02/2012 12:18 PM Performed by: Verlan Friends Pre-anesthesia Checklist: Patient identified, Emergency Drugs available, Suction available, Patient being monitored and Timeout performed Patient Re-evaluated:Patient Re-evaluated prior to inductionOxygen Delivery Method: Circle System Utilized Preoxygenation: Pre-oxygenation with 100% oxygen Intubation Type: IV induction Ventilation: Mask ventilation without difficulty LMA: LMA inserted LMA Size: 4.0 Number of attempts: 1 Airway Equipment and Method: bite block Placement Confirmation: positive ETCO2 Tube secured with: Tape Dental Injury: Teeth and Oropharynx as per pre-operative assessment

## 2012-05-02 NOTE — Transfer of Care (Signed)
Immediate Anesthesia Transfer of Care Note  Patient: Danielle Spears  Procedure(s) Performed: Procedure(s): KNEE ARTHROSCOPY WITH PARTIAL MEDIAL AND LATERAL MENISECTOMY AND PATELLA-FEMORAL  CHONDROPLASTY (Right)  Patient Location: PACU  Anesthesia Type:General  Level of Consciousness: awake, alert , oriented and patient cooperative  Airway & Oxygen Therapy: Patient Spontanous Breathing and Patient connected to face mask oxygen  Post-op Assessment: Report given to PACU RN and Post -op Vital signs reviewed and stable  Post vital signs: Reviewed and stable  Complications: No apparent anesthesia complications

## 2012-05-05 ENCOUNTER — Encounter (HOSPITAL_BASED_OUTPATIENT_CLINIC_OR_DEPARTMENT_OTHER): Payer: Self-pay | Admitting: Orthopedic Surgery

## 2012-05-05 LAB — POCT HEMOGLOBIN-HEMACUE: Hemoglobin: 13.5 g/dL (ref 12.0–15.0)

## 2012-05-05 NOTE — Op Note (Signed)
NAMEAMIEL, SHARROW                ACCOUNT NO.:  [id number]  MEDICAL RECORD NO.:  [id number]  LOCATION:                                 FACILITY:  PHYSICIAN:  Harvie Junior, M.D.   DATE OF BIRTH:  1955-10-21  DATE OF PROCEDURE:  05/02/2012 DATE OF DISCHARGE:                              OPERATIVE REPORT   PREOPERATIVE DIAGNOSIS:  Medial meniscal tear, right leg.  POSTOPERATIVE DIAGNOSES: 1. Medial meniscal tear, right leg. 2. Lateral meniscal tear. 3. 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  PRINCIPAL PROCEDURES: 1. Partial medial and partial lateral meniscectomies with     corresponding debridement in the medial and lateral compartments. 2. Chondroplasty of patellofemoral joint down to bleeding bone.  SURGEON:  Harvie Junior, M.D.  ASSISTANT:  Marshia Ly, P.A.  ANESTHESIA:  General.  BRIEF HISTORY:  Ms. Saintil is a 57 year old female with history of having significant complaints of right knee pain.  She was unable to bear weight at this point because of something that feel like it was stuck in the knee.  After failure of conservative care, MRI was obtained, at least by report of MRI showed that she had a tear of the meniscus which was flipped anterior to the anterior horn of the meniscus, I could only see one anterior horn of the meniscus, but it did seem to be pulled off the front of the meniscus and at that point with her irritability stand, we felt that she needed to come to the operating room for operative intervention.  PROCEDURE:  The patient was taken to the operating room.  After adequate anesthesia was obtained with general anesthetic, the patient was placed supine on the operating table.  She was then prepped and draped in usual sterile fashion.  Once this was completed, attention was turned to the right knee where routine arthroscopic examination of the knee revealed that there was  a complex tear of the posterior horn of the medial meniscus, which was debrided back to a smooth and stable rim.  Once this was done, attention was turned to anteriorly, was a little bit of a complex tear anteriorly, but nothing dramatic and there was no fragment or anything over the tibial plateau.  The anterior horn did in fact track a little anteriorly, but it certainly did not come over the anterior portion.  Once this had been addressed, attention was turned towards the ACL, which was normal.  Lateral side had an anterior horn lateral meniscal tear which was debrided back to a smooth and stable rim, and there was a complex area on the lateral femoral condyle, which was debrided.  Once completed, turned back up to the patellofemoral joint where there was no real pressure on the patella.  The patella did track laterally, but there was really no hyperpressure.  There was dramatic grade 4 change throughout the patellofemoral joint.  At that point, it did not feel that lateral retinacular release made any sense and it was not performed.  At this point, the knee was copiously and thoroughly lavaged, suctioned dry.  The arthroscopic portals were  closed with bandage. Sterile compressive dressing was applied.  The patient was taken to the recovery room, she was noted to be in satisfactory condition.  Estimated blood loss for the procedure was none.     Harvie Junior, M.D.     Ranae Plumber  D:  05/02/2012  T:  05/03/2012  Job:  [id number]

## 2012-07-28 ENCOUNTER — Encounter: Payer: Self-pay | Admitting: *Deleted

## 2012-09-25 ENCOUNTER — Telehealth: Payer: Self-pay | Admitting: Family Medicine

## 2012-09-25 MED ORDER — FLUTICASONE-SALMETEROL 250-50 MCG/DOSE IN AEPB: 1.0000 | INHALATION_SPRAY | Freq: Two times a day (BID) | RESPIRATORY_TRACT | Status: DC

## 2012-09-25 NOTE — Telephone Encounter (Signed)
Med sent electronically to [facility]. Patient notified.

## 2012-09-25 NOTE — Telephone Encounter (Signed)
Rx for advair disc to [facility] (p) [phone]  Please call patient when complete.

## 2012-09-25 NOTE — Telephone Encounter (Signed)
She may have prescription, 90 day, one refill, followup office visit this fall.

## 2012-11-17 ENCOUNTER — Ambulatory Visit (INDEPENDENT_AMBULATORY_CARE_PROVIDER_SITE_OTHER): Payer: Managed Care, Other (non HMO) | Admitting: Family Medicine

## 2012-11-17 ENCOUNTER — Encounter: Payer: Self-pay | Admitting: Family Medicine

## 2012-11-17 VITALS — BP 110/70 | Temp 98.3°F | Ht 63.0 in | Wt 181.2 lb

## 2012-11-17 DIAGNOSIS — J019 Acute sinusitis, unspecified: Secondary | ICD-10-CM

## 2012-11-17 MED ORDER — AZITHROMYCIN 250 MG PO TABS: ORAL_TABLET | ORAL | Status: DC

## 2012-11-17 NOTE — Progress Notes (Signed)
  Subjective:    Patient ID: Danielle Spears, female    DOB: [birth year], 57 y.o.   MRN: [id number]  Sinusitis This is a new problem. The current episode started in the past 7 days. The problem has been gradually worsening since onset. There has been no fever. The pain is mild. Associated symptoms include congestion, coughing, headaches, sneezing and a sore throat. Past treatments include oral decongestants. The treatment provided no relief.  tried decongestant PMH- allergiea/asthma   Review of Systems  HENT: Positive for congestion, sneezing and sore throat.   Respiratory: Positive for cough.   Neurological: Positive for headaches.  no wheeze No fevers    Objective:   Physical Exam  Moderate sinus tenderness eardrums normal throat is normal neck is supple lungs clear heart regular     Assessment & Plan:  Acute sinusitis antibiotics prescribed followup if progressive troubles warning signs were discussed recheck if any other issues.

## 2012-11-27 ENCOUNTER — Ambulatory Visit (INDEPENDENT_AMBULATORY_CARE_PROVIDER_SITE_OTHER): Payer: Managed Care, Other (non HMO) | Admitting: *Deleted

## 2012-11-27 ENCOUNTER — Other Ambulatory Visit: Payer: Self-pay

## 2012-11-27 DIAGNOSIS — Z23 Encounter for immunization: Secondary | ICD-10-CM

## 2013-02-05 ENCOUNTER — Encounter: Payer: Self-pay | Admitting: Nurse Practitioner

## 2013-02-05 ENCOUNTER — Ambulatory Visit (INDEPENDENT_AMBULATORY_CARE_PROVIDER_SITE_OTHER): Payer: Managed Care, Other (non HMO) | Admitting: Nurse Practitioner

## 2013-02-05 VITALS — BP 130/82 | Temp 98.6°F | Ht 62.5 in | Wt 183.0 lb

## 2013-02-05 DIAGNOSIS — J45901 Unspecified asthma with (acute) exacerbation: Secondary | ICD-10-CM

## 2013-02-05 DIAGNOSIS — J069 Acute upper respiratory infection, unspecified: Secondary | ICD-10-CM

## 2013-02-05 DIAGNOSIS — J209 Acute bronchitis, unspecified: Secondary | ICD-10-CM

## 2013-02-05 MED ORDER — LEVOFLOXACIN 500 MG PO TABS: 500.0000 mg | ORAL_TABLET | Freq: Every day | ORAL | Status: DC

## 2013-02-05 MED ORDER — PREDNISONE 20 MG PO TABS: ORAL_TABLET | ORAL | Status: DC

## 2013-02-05 NOTE — Patient Instructions (Signed)
Mucinex DM as directed 

## 2013-02-06 ENCOUNTER — Encounter: Payer: Self-pay | Admitting: Nurse Practitioner

## 2013-02-06 DIAGNOSIS — J45909 Unspecified asthma, uncomplicated: Secondary | ICD-10-CM | POA: Insufficient documentation

## 2013-02-06 NOTE — Assessment & Plan Note (Signed)
.   albuterol (PROVENTIL HFA;VENTOLIN HFA) 108 (90 BASE) MCG/ACT inhaler    Sig: Inhale into the lungs every 6 (six) hours as needed for wheezing or shortness of breath.  . levofloxacin (LEVAQUIN) 500 MG tablet    Sig: Take 1 tablet (500 mg total) by mouth daily.    Dispense:  10 tablet    Refill:  0    Order Specific Question:  Supervising Provider    Answer:  Merlyn AlbertLUKING, WILLIAM S [[id number]]  . predniSONE (DELTASONE) 20 MG tablet    Sig: 3 po qd x 3 d then 2 po qd x 3 d then 1 po qd x 3 d    Dispense:  18 tablet    Refill:  0    Order Specific Question:  Supervising Provider    Answer:  Merlyn AlbertLUKING, WILLIAM S [[id number]]   continue current medications as directed. Use albuterol as directed as directed frequent cough or wheezing. Given prescription for prednisone in case needed over the weekend. Call back next week if no improvement, sooner if worse.

## 2013-02-06 NOTE — Progress Notes (Signed)
Subjective:  Presents for complaints of cough wheezing and fatigue that began about a week ago. Currently on Advair. History of asthma. No fever. Occasional cough. Symptoms began after being exposed to cigarette smoke. Slightly productive cough, producing green mucus. Runny nose. No ear pain sore throat. No vomiting diarrhea or abdominal pain. No reflux. No headache. Has used her albuterol twice since illness began, no relief. Mild fatigue. Bad taste in her mouth when she coughs. Slight chest pain with deep breath or cough.  Objective:   BP 130/82  Temp(Src) 98.6 F (37 C) (Oral)  Ht 5' 2.5" (1.588 m)  Wt 183 lb (83.008 kg)  BMI 32.92 kg/m2 NAD. Alert, oriented. TMs clear effusion, no erythema. Pharynx injected with PND noted. Neck supple with mild soft anterior adenopathy. Lungs scattered faint expiratory crackles, no wheezing or tachypnea. Heart regular rate rhythm.  Assessment:Acute upper respiratory infections of unspecified site  Acute bronchitis  Asthma with acute exacerbation  Plan: Meds ordered this encounter  Medications  . albuterol (PROVENTIL HFA;VENTOLIN HFA) 108 (90 BASE) MCG/ACT inhaler    Sig: Inhale into the lungs every 6 (six) hours as needed for wheezing or shortness of breath.  . levofloxacin (LEVAQUIN) 500 MG tablet    Sig: Take 1 tablet (500 mg total) by mouth daily.    Dispense:  10 tablet    Refill:  0    Order Specific Question:  Supervising Provider    Answer:  Merlyn AlbertLUKING, WILLIAM S [[id number]]  . predniSONE (DELTASONE) 20 MG tablet    Sig: 3 po qd x 3 d then 2 po qd x 3 d then 1 po qd x 3 d    Dispense:  18 tablet    Refill:  0    Order Specific Question:  Supervising Provider    Answer:  Merlyn AlbertLUKING, WILLIAM S [[id number]]   continue current medications as directed. Use albuterol as directed as directed frequent cough or wheezing. Given prescription for prednisone in case needed over the weekend. Call back next week if no improvement, sooner if worse.

## 2013-02-10 ENCOUNTER — Telehealth: Payer: Self-pay | Admitting: Family Medicine

## 2013-02-10 NOTE — Telephone Encounter (Signed)
Patient wants to know if she can take antibiotic and prednisone at the same time. (Was told to get prednisone filled if not feeling any better from just antibiotic)

## 2013-02-10 NOTE — Telephone Encounter (Signed)
Left message on voicemail notifying patient that antibiotic and prednisone can be taken together.

## 2013-03-23 ENCOUNTER — Ambulatory Visit (INDEPENDENT_AMBULATORY_CARE_PROVIDER_SITE_OTHER): Payer: Managed Care, Other (non HMO) | Admitting: Family Medicine

## 2013-03-23 ENCOUNTER — Encounter: Payer: Self-pay | Admitting: Family Medicine

## 2013-03-23 VITALS — BP 130/82 | Temp 98.5°F | Ht 62.5 in | Wt 176.6 lb

## 2013-03-23 DIAGNOSIS — J45909 Unspecified asthma, uncomplicated: Secondary | ICD-10-CM

## 2013-03-23 DIAGNOSIS — J019 Acute sinusitis, unspecified: Secondary | ICD-10-CM

## 2013-03-23 MED ORDER — AZITHROMYCIN 250 MG PO TABS: ORAL_TABLET | ORAL | Status: DC

## 2013-03-23 MED ORDER — METHYLPREDNISOLONE ACETATE 40 MG/ML IJ SUSP
40.0000 mg | Freq: Once | INTRAMUSCULAR | Status: AC
  Administered 2013-03-23: 40 mg via INTRAMUSCULAR

## 2013-03-23 NOTE — Progress Notes (Signed)
   Subjective:    Patient ID: Danielle Spears, female    DOB: 1955-08-07, 58 y.o.   MRN: [id number]  Cough This is a new problem. The current episode started in the past 7 days. Associated symptoms include chills, nasal congestion, rhinorrhea and wheezing. Pertinent negatives include no chest pain or fever.   Patient is also noted some hair loss she plans on coming in and seen the nurse practitioner in the near future she states is mainly in the front part of her head and seems to be getting thinner. She denies any other symptoms no voice change no   Review of Systems  Constitutional: Positive for chills, appetite change (low) and fatigue. Negative for fever.  HENT: Positive for congestion and rhinorrhea.   Respiratory: Positive for cough and wheezing.   Cardiovascular: Negative for chest pain.       Objective:   Physical Exam  Nursing note and vitals reviewed. Constitutional: She appears well-developed.  HENT:  Head: Normocephalic.  Nose: Nose normal.  Mouth/Throat: Oropharynx is clear and moist. No oropharyngeal exudate.  Neck: Neck supple.  Cardiovascular: Normal rate and normal heart sounds.   No murmur heard. Pulmonary/Chest: Effort normal and breath sounds normal. She has no wheezes.  Lymphadenopathy:    She has no cervical adenopathy.  Skin: Skin is warm and dry.          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 Check lab work patient will call for lab work before and next visit. Will be checking TSH, free T4, free testosterone level, total testosterone level, and DHEA

## 2013-03-25 ENCOUNTER — Telehealth: Payer: Self-pay | Admitting: Family Medicine

## 2013-03-25 MED ORDER — TRAMADOL HCL 50 MG PO TABS: 50.0000 mg | ORAL_TABLET | Freq: Four times a day (QID) | ORAL | Status: DC | PRN

## 2013-03-25 NOTE — Telephone Encounter (Signed)
May use tramadol 50 mg 1 every 6 hours when necessary pain #24, cautioned drowsiness

## 2013-03-25 NOTE — Telephone Encounter (Signed)
Was seen on 3/2, given Depo Medrol shot and ZPAK

## 2013-03-25 NOTE — Telephone Encounter (Signed)
Patient says that her sinus infection is causing her to have a really bad headache radiating into neck and shoulders and she is wanting some kind of pain medication called in.   [facility]

## 2013-03-25 NOTE — Telephone Encounter (Signed)
Patient notified and verbalized understanding. 

## 2013-04-02 ENCOUNTER — Other Ambulatory Visit: Payer: Self-pay | Admitting: Family Medicine

## 2013-06-03 ENCOUNTER — Other Ambulatory Visit: Payer: Self-pay | Admitting: Family Medicine

## 2013-08-04 ENCOUNTER — Encounter: Payer: Self-pay | Admitting: Nurse Practitioner

## 2013-08-04 ENCOUNTER — Ambulatory Visit (INDEPENDENT_AMBULATORY_CARE_PROVIDER_SITE_OTHER): Payer: Managed Care, Other (non HMO) | Admitting: Nurse Practitioner

## 2013-08-04 VITALS — BP 114/80 | Temp 98.2°F | Ht 62.5 in | Wt 160.2 lb

## 2013-08-04 DIAGNOSIS — J329 Chronic sinusitis, unspecified: Secondary | ICD-10-CM

## 2013-08-04 DIAGNOSIS — J209 Acute bronchitis, unspecified: Secondary | ICD-10-CM

## 2013-08-04 MED ORDER — METHYLPREDNISOLONE ACETATE 40 MG/ML IJ SUSP
40.0000 mg | Freq: Once | INTRAMUSCULAR | Status: AC
  Administered 2013-08-04: 40 mg via INTRAMUSCULAR

## 2013-08-04 MED ORDER — LEVOFLOXACIN 500 MG PO TABS: 500.0000 mg | ORAL_TABLET | Freq: Every day | ORAL | Status: DC

## 2013-08-04 MED ORDER — HYDROCODONE-ACETAMINOPHEN 5-325 MG PO TABS: 1.0000 | ORAL_TABLET | ORAL | Status: DC | PRN

## 2013-08-04 MED ORDER — PREDNISONE 20 MG PO TABS: ORAL_TABLET | ORAL | Status: DC

## 2013-08-06 ENCOUNTER — Encounter: Payer: Self-pay | Admitting: Nurse Practitioner

## 2013-08-06 NOTE — Progress Notes (Signed)
Subjective:  Presents with complaints of sinus symptoms over the past 1-1/2 weeks. Facial area headache which has worsened. No fever. Occasional cough. Runny nose. Producing yellow mucus. Slight wheeze. Sore throat is improved. No ear pain. Ear pressure. Slight dizziness. Mild localized chest pain with coughing. No shortness of breath. No vomiting diarrhea abdominal pain. Taking fluids well.  Objective:   BP 114/80  Temp(Src) 98.2 F (36.8 C) (Oral)  Ht 5' 2.5" (1.588 m)  Wt 160 lb 4 oz (72.689 kg)  BMI 28.82 kg/m2 NAD. Alert, oriented. TMs clear effusion, no erythema. Pharynx injected with PND noted. Neck supple with mild soft anterior adenopathy. Lungs faint scattered expiratory rhonchi, no wheezing or tachypnea. Heart regular rate rhythm.  Assessment: Rhinosinusitis - Plan: methylPREDNISolone acetate (DEPO-MEDROL) injection 40 mg  Acute bronchitis, unspecified organism - Plan: methylPREDNISolone acetate (DEPO-MEDROL) injection 40 mg  Plan:  Meds ordered this encounter  Medications  . HYDROcodone-acetaminophen (NORCO/VICODIN) 5-325 MG per tablet    Sig: Take 1 tablet by mouth every 4 (four) hours as needed.    Dispense:  30 tablet    Refill:  0    Order Specific Question:  Supervising Provider    Answer:  Merlyn AlbertLUKING, WILLIAM S [[id number]]  . methylPREDNISolone acetate (DEPO-MEDROL) injection 40 mg    Sig:   . predniSONE (DELTASONE) 20 MG tablet    Sig: 3 po qd x 3 d then 2 po qd x 3 d then 1 po qd x 3 d    Dispense:  18 tablet    Refill:  0    Order Specific Question:  Supervising Provider    Answer:  Merlyn AlbertLUKING, WILLIAM S [[id number]]  . levofloxacin (LEVAQUIN) 500 MG tablet    Sig: Take 1 tablet (500 mg total) by mouth daily.    Dispense:  10 tablet    Refill:  0    Order Specific Question:  Supervising Provider    Answer:  Merlyn AlbertLUKING, WILLIAM S [[id number]]   Warning signs reviewed. Start prednisone taper if no improvement in symptoms. Return if symptoms worsen or fail to improve.

## 2013-09-07 ENCOUNTER — Other Ambulatory Visit: Payer: Self-pay | Admitting: Family Medicine

## 2013-09-09 ENCOUNTER — Telehealth: Payer: Self-pay | Admitting: Family Medicine

## 2013-09-09 DIAGNOSIS — Z13220 Encounter for screening for lipoid disorders: Secondary | ICD-10-CM

## 2013-09-09 DIAGNOSIS — R5383 Other fatigue: Principal | ICD-10-CM

## 2013-09-09 DIAGNOSIS — Z131 Encounter for screening for diabetes mellitus: Secondary | ICD-10-CM

## 2013-09-09 DIAGNOSIS — R5381 Other malaise: Secondary | ICD-10-CM

## 2013-09-09 NOTE — Telephone Encounter (Signed)
[facility]BW orders are in)

## 2013-09-09 NOTE — Telephone Encounter (Signed)
I would recommend TSH, free T4, T3. Also if she would has not had it done recently she does need lipid profile and glucose

## 2013-09-09 NOTE — Telephone Encounter (Signed)
Patient notified and verbalized understanding. 

## 2013-09-09 NOTE — Telephone Encounter (Signed)
Patient needs order for blood work. She said to refresh Dr. Roby LoftsScott's memory it was because she was losing her hair. Also, Eber JonesCarolyn wanted to check her thyroid.

## 2013-09-23 LAB — T3: T3, Total: 111.3 ng/dL (ref 80.0–204.0)

## 2013-09-23 LAB — LIPID PANEL
Cholesterol: 201 mg/dL — ABNORMAL HIGH (ref 0–200)
HDL: 71 mg/dL (ref >39)
LDL CALC: 115 mg/dL — AB (ref 0–99)
TRIGLYCERIDES: 75 mg/dL (ref <150)
Total CHOL/HDL Ratio: 2.8 Ratio
VLDL: 15 mg/dL (ref 0–40)

## 2013-09-23 LAB — GLUCOSE, RANDOM: Glucose, Bld: 85 mg/dL (ref 70–99)

## 2013-09-23 LAB — T4, FREE: FREE T4: 1.09 ng/dL (ref 0.80–1.80)

## 2013-09-23 LAB — TSH: TSH: 1.426 u[IU]/mL (ref 0.350–4.500)

## 2013-10-09 ENCOUNTER — Telehealth: Payer: Self-pay | Admitting: Internal Medicine

## 2013-10-09 NOTE — Telephone Encounter (Signed)
Patient unable to eat. Feels like a gas bubble in throat when she swallows.  Food is refluxing.  Maybe her nerves she feels.  We do not have any openings until October.  Patient said last time this happened it was nerves.  Been taking Nexium for 3 days.  Please call her. [phone]

## 2013-10-09 NOTE — Telephone Encounter (Signed)
I called and spoke to pt. She said she is unable to eat or drink very much. She can get the liquids down and had some chicken broth this morning. However, she said it feels like a big lump in her throat when she tries to swallow and her throat hurts very bad then.  She has not been seen here since her colonoscopy in 02/2012.  Please advise!

## 2013-10-09 NOTE — Telephone Encounter (Signed)
PT did say she is having some problems with her nerves and she feels like that is making this problem worse.

## 2013-10-09 NOTE — Telephone Encounter (Signed)
We can offer her an office visit within the next 2 weeks. Unfortunately we do not have anything available next week and we have not seen her in the office in years.   She can see her PCP in the interim if needed. If swallowing worsens, cannot get anything down or pain increases I would suggest ER evaluation.

## 2013-10-09 NOTE — Telephone Encounter (Signed)
I called and informed pt. Appt is scheduled for 10/20/2013 at 3:00 Pm with Gerrit Halls, NP ( in urgent slot). No regular appts until 11/05/2013.

## 2013-10-12 ENCOUNTER — Encounter: Payer: Self-pay | Admitting: Family Medicine

## 2013-10-12 ENCOUNTER — Ambulatory Visit (INDEPENDENT_AMBULATORY_CARE_PROVIDER_SITE_OTHER): Payer: Managed Care, Other (non HMO) | Admitting: Family Medicine

## 2013-10-12 VITALS — BP 128/90 | Temp 98.3°F | Ht 62.5 in | Wt 162.5 lb

## 2013-10-12 DIAGNOSIS — K209 Esophagitis, unspecified without bleeding: Secondary | ICD-10-CM

## 2013-10-12 DIAGNOSIS — R1310 Dysphagia, unspecified: Secondary | ICD-10-CM

## 2013-10-12 MED ORDER — PANTOPRAZOLE SODIUM 40 MG PO TBEC: 40.0000 mg | DELAYED_RELEASE_TABLET | Freq: Every day | ORAL | Status: DC

## 2013-10-12 MED ORDER — ALPRAZOLAM 0.5 MG PO TABS: ORAL_TABLET | ORAL | Status: DC

## 2013-10-12 NOTE — Progress Notes (Signed)
   Subjective:    Patient ID: Danielle Spears, female    DOB: 03-15-1955, 58 y.o.   MRN: [id number]  Back Pain This is a new problem. The current episode started in the past 7 days. The problem occurs intermittently. The problem is unchanged. The pain is present in the lumbar spine. The pain does not radiate. The pain is moderate. The pain is the same all the time. Stiffness is present all day. Associated symptoms include abdominal pain and weakness. Pertinent negatives include no chest pain. She has tried nothing for the symptoms. The treatment provided no relief.   she denies any radiation down the leg. Patient states it is hard to swallow. Patient states her chest hurts and she has a burning sensation in her abdomen.  Patient denies it getting stuck in her throat she has not lost weight she denies high fever chills or sweats. Lab work was reviewed with patient. Review of Systems  HENT: Negative for congestion.   Respiratory: Negative for cough and shortness of breath.   Cardiovascular: Negative for chest pain and leg swelling.  Gastrointestinal: Positive for abdominal pain. Negative for diarrhea and blood in stool.  Musculoskeletal: Positive for back pain.  Neurological: Positive for weakness.       Objective:   Physical Exam  Vitals reviewed. Constitutional: She appears well-nourished. No distress.  Cardiovascular: Normal rate, regular rhythm and normal heart sounds.   No murmur heard. Pulmonary/Chest: Effort normal and breath sounds normal. No respiratory distress.  Musculoskeletal: She exhibits no edema.  Lymphadenopathy:    She has no cervical adenopathy.  Neurological: She is alert. She exhibits normal muscle tone.  Psychiatric: Her behavior is normal.          Assessment & Plan:  Intermittent back pain I showed her some stretches she can do she may also use anti-inflammatories should gradually get better  Significant dysphagia along with esophagitis she has an  appointment next week with gastroenterology. I believe this patient will need EGD. May need dilatation as well. She was instructed to use PPI.  She is under a lot of stress this could be intercalating as well Xanax when necessary caution drowsiness she denies being depressed

## 2013-10-20 ENCOUNTER — Encounter: Payer: Self-pay | Admitting: Gastroenterology

## 2013-10-20 ENCOUNTER — Ambulatory Visit (INDEPENDENT_AMBULATORY_CARE_PROVIDER_SITE_OTHER): Payer: Managed Care, Other (non HMO) | Admitting: Gastroenterology

## 2013-10-20 ENCOUNTER — Encounter (HOSPITAL_COMMUNITY): Payer: Self-pay | Admitting: Pharmacy Technician

## 2013-10-20 ENCOUNTER — Other Ambulatory Visit: Payer: Self-pay

## 2013-10-20 VITALS — BP 111/76 | HR 59 | Temp 97.4°F | Ht 62.0 in | Wt 160.0 lb

## 2013-10-20 DIAGNOSIS — R1310 Dysphagia, unspecified: Secondary | ICD-10-CM

## 2013-10-20 DIAGNOSIS — R1319 Other dysphagia: Secondary | ICD-10-CM

## 2013-10-20 DIAGNOSIS — B370 Candidal stomatitis: Secondary | ICD-10-CM

## 2013-10-20 DIAGNOSIS — R109 Unspecified abdominal pain: Secondary | ICD-10-CM

## 2013-10-20 MED ORDER — SUCRALFATE 1 GM/10ML PO SUSP: 1.0000 g | Freq: Four times a day (QID) | ORAL | Status: DC

## 2013-10-20 MED ORDER — NYSTATIN 100000 UNIT/ML MT SUSP: 5.0000 mL | Freq: Four times a day (QID) | OROMUCOSAL | Status: DC

## 2013-10-20 NOTE — Assessment & Plan Note (Signed)
Noted on exam. Nystatin prescribed.

## 2013-10-20 NOTE — Progress Notes (Signed)
Primary Care Physician:  Lilyan Punt, MD Primary Gastroenterologist:  Dr.  Jena Gauss  Chief Complaint  Patient presents with  . Dysphagia    HPI:   Danielle Spears presents today with  dysphagia. Last EGD with empiric dilation by Dr. Karilyn Cota in 2004. 3 month history of throat pain, pain in chest. Started on Protonix on 9/21 with slight improvement. Ate a boiled egg this morning, which she regurgitated. Belching now. Epigastric burning. States odynophagia and dysphagia prior to starting Protonix, improved now. Ate steak recently with pain. Ate one point couldn't swallow water. Just taking in water, soft foods. No melena. No NSAIDs. No aspirin powders. Complains of feeling weak and dizzy. Prior to starting Protonix had tried multiple OTC PPIs without much improvement.   Past Medical History  Diagnosis Date  . Asthma   . Seasonal allergies     Past Surgical History  Procedure Laterality Date  . Cesarean section    . 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 Knee arthroscopy with medial menisectomy Right 05/02/2012    Procedure: KNEE ARTHROSCOPY WITH PARTIAL MEDIAL AND LATERAL MENISECTOMY AND PATELLA-FEMORAL  CHONDROPLASTY;  Surgeon: Harvie Junior, MD;  Location: [facility];  Service: Orthopedics;  Laterality: Right;  . Esophagogastroduodenoscopy  2004    Dr. Karilyn Cota: empiric dilation, gastric and pyloric channel inflammation    Current Outpatient Prescriptions  Medication Sig Dispense Refill  . ADVAIR DISKUS 250-50 MCG/DOSE AEPB USE 1 INHALATION ORALLY EVERY 12 HOURS  180 each  1  . ALPRAZolam (XANAX) 0.5 MG tablet 1/2 to 1 tid prn anxiousness  30 tablet  1  . b complex vitamins tablet Take 1 tablet by mouth daily.      . cetirizine (ZYRTEC) 10 MG tablet Take 10 mg by mouth daily.      . fexofenadine (ALLEGRA) 180 MG tablet Take 180 mg by mouth daily as needed.      . fish oil-omega-3 fatty acids 1000 MG capsule Take 2 g by mouth daily.      .  Multiple Vitamins-Minerals (MULTIVITAMIN WITH MINERALS) tablet Take 1 tablet by mouth daily.      . pantoprazole (PROTONIX) 40 MG tablet Take 1 tablet (40 mg total) by mouth daily.  30 tablet  3  . VENTOLIN HFA 108 (90 BASE) MCG/ACT inhaler INHALE TWO PUFFS BY MOUTH EVERY 4 HOURS AS NEEDED  18 each  5  . vitamin C (ASCORBIC ACID) 500 MG tablet Take 500 mg by mouth daily.       No current facility-administered medications for this visit.    Allergies as of 10/20/2013  . (No Known Allergies)    Family History  Problem Relation Age of Onset  . Diabetes Father   . Heart attack Father   . Colon cancer Neg Hx     History   Social History  . Marital Status: Married    Spouse Name: N/A    Number of Children: N/A  . Years of Education: N/A   Occupational History  . Not on file.   Social History Main Topics  . Smoking status: Former Smoker    Quit date: 05/01/1999  . Smokeless tobacco: Not on file  . Alcohol Use: No  . Drug Use: No  . Sexual Activity: Not on file   Other Topics Concern  . Not on file   Social History Narrative  . No narrative on file    Review of Systems: Negative  unless mentioned in HPI.   Physical Exam: BP 111/76  Pulse 59  Temp(Src) 97.4 F (36.3 C) (Oral)  Ht 5\' 2"  (1.575 m)  Wt 160 lb (72.576 kg)  BMI 29.26 kg/m2 General:   Alert and oriented. Pleasant and cooperative. Well-nourished and well-developed.  Head:  Normocephalic and atraumatic. Eyes:  Without icterus, sclera clear and conjunctiva pink.  Ears:  Normal auditory acuity. Nose:  No deformity, discharge,  or lesions. Mouth:  Oral thrush noted on tongue Lungs:  Clear to auscultation bilaterally. No wheezes, rales, or rhonchi. No distress.  Heart:  S1, S2 present without murmurs appreciated.  Abdomen:  +BS, soft, non-tender and non-distended. No HSM noted. No guarding or rebound. No masses appreciated.  Rectal:  Deferred  Msk:  Symmetrical without gross deformities. Normal  posture. Extremities:  Without edema. Neurologic:  Alert and  oriented x4;  grossly normal neurologically. Skin:  Intact without significant lesions or rashes. Psych:  Alert and cooperative. Normal mood and affect.

## 2013-10-20 NOTE — Progress Notes (Signed)
cc'ed to pcp °

## 2013-10-20 NOTE — Patient Instructions (Signed)
You have been scheduled for an upper endoscopy with possible dilation with Dr. Jena Gaussourk.  For oral thrush: start taking Nystatin as directed 4 times a day. Swish as long as you can then swallow.

## 2013-10-20 NOTE — Assessment & Plan Note (Signed)
58 year old female with persistent dysphagia/odynophagia, epigastric discomfort for 3 months; symptom onset acutely at that time, worsening in nature. Some mild improvement with PPI. Remains on soft diet, liquids. Last EGD in 2004 with empiric dilation. Oral thrush noted on exam; query candida esophagitis as culprit. Needs EGD for further assessment with possible dilation as appropriate.   Proceed with upper endoscopy with possible dilation in the near future with Dr. Jena Gaussourk. The risks, benefits, and alternatives have been discussed in detail with patient. They have stated understanding and desire to proceed.  Continue Protonix daily Start Nystatin QID

## 2013-10-26 ENCOUNTER — Encounter (HOSPITAL_COMMUNITY)
Admission: RE | Disposition: A | Discharge status: Home / Self Care | Payer: Self-pay | Source: Ambulatory Visit | Attending: Internal Medicine

## 2013-10-26 ENCOUNTER — Ambulatory Visit (HOSPITAL_COMMUNITY)
Admission: RE | Admit: 2013-10-26 | Discharge: 2013-10-26 | Disposition: A | Discharge status: Home / Self Care | Payer: Managed Care, Other (non HMO) | Source: Ambulatory Visit | Attending: Internal Medicine | Admitting: Internal Medicine

## 2013-10-26 ENCOUNTER — Encounter (HOSPITAL_COMMUNITY): Payer: Self-pay | Admitting: *Deleted

## 2013-10-26 DIAGNOSIS — M549 Dorsalgia, unspecified: Secondary | ICD-10-CM | POA: Diagnosis not present

## 2013-10-26 DIAGNOSIS — K2990 Gastroduodenitis, unspecified, without bleeding: Secondary | ICD-10-CM

## 2013-10-26 DIAGNOSIS — R1310 Dysphagia, unspecified: Secondary | ICD-10-CM | POA: Diagnosis not present

## 2013-10-26 DIAGNOSIS — R1013 Epigastric pain: Secondary | ICD-10-CM | POA: Diagnosis not present

## 2013-10-26 DIAGNOSIS — R109 Unspecified abdominal pain: Secondary | ICD-10-CM

## 2013-10-26 DIAGNOSIS — K208 Other esophagitis: Secondary | ICD-10-CM | POA: Diagnosis not present

## 2013-10-26 DIAGNOSIS — K2970 Gastritis, unspecified, without bleeding: Secondary | ICD-10-CM

## 2013-10-26 HISTORY — PX: MALONEY DILATION: SHX5535

## 2013-10-26 HISTORY — PX: ESOPHAGOGASTRODUODENOSCOPY: SHX5428

## 2013-10-26 SURGERY — EGD (ESOPHAGOGASTRODUODENOSCOPY)
Anesthesia: Moderate Sedation

## 2013-10-26 MED ORDER — STERILE WATER FOR IRRIGATION IR SOLN
Status: DC | PRN
  Administered 2013-10-26: 13:00:00

## 2013-10-26 MED ORDER — SODIUM CHLORIDE 0.9 % IV SOLN
INTRAVENOUS | Status: DC
  Administered 2013-10-26: 1000 mL via INTRAVENOUS

## 2013-10-26 MED ORDER — ONDANSETRON HCL 4 MG/2ML IJ SOLN
INTRAMUSCULAR | Status: AC
  Filled 2013-10-26: qty 2

## 2013-10-26 MED ORDER — LIDOCAINE VISCOUS 2 % MT SOLN
OROMUCOSAL | Status: AC
  Filled 2013-10-26: qty 15

## 2013-10-26 MED ORDER — MIDAZOLAM HCL 5 MG/5ML IJ SOLN
INTRAMUSCULAR | Status: DC | PRN
  Administered 2013-10-26 (×2): 1 mg via INTRAVENOUS
  Administered 2013-10-26: 2 mg via INTRAVENOUS
  Administered 2013-10-26: 1 mg via INTRAVENOUS

## 2013-10-26 MED ORDER — MEPERIDINE HCL 100 MG/ML IJ SOLN
INTRAMUSCULAR | Status: AC
  Filled 2013-10-26: qty 2

## 2013-10-26 MED ORDER — MIDAZOLAM HCL 5 MG/5ML IJ SOLN
INTRAMUSCULAR | Status: AC
  Filled 2013-10-26: qty 10

## 2013-10-26 MED ORDER — LIDOCAINE VISCOUS 2 % MT SOLN
OROMUCOSAL | Status: DC | PRN
  Administered 2013-10-26: 4 mL via OROMUCOSAL

## 2013-10-26 MED ORDER — MEPERIDINE HCL 100 MG/ML IJ SOLN
INTRAMUSCULAR | Status: DC | PRN
  Administered 2013-10-26: 25 mg via INTRAVENOUS
  Administered 2013-10-26: 50 mg

## 2013-10-26 MED ORDER — ONDANSETRON HCL 4 MG/2ML IJ SOLN
INTRAMUSCULAR | Status: DC | PRN
  Administered 2013-10-26: 4 mg via INTRAVENOUS

## 2013-10-26 NOTE — Interval H&P Note (Signed)
History and Physical Interval Note:  10/26/2013 1:20 PM  Arlana PouchRobin L Salih  has presented today for surgery, with the diagnosis of dysphagia/abd apin  The various methods of treatment have been discussed with the patient and family. After consideration of risks, benefits and other options for treatment, the patient has consented to  Procedure(s): ESOPHAGOGASTRODUODENOSCOPY (EGD) (N/A) MALONEY DILATION (N/A) as a surgical intervention .  The patient's history has been reviewed, patient examined, no change in status, stable for surgery.  I have reviewed the patient's chart and labs.  Questions were answered to the patient's satisfaction.     Tylasia Fletchall  No change. EGD with dilation, etc. as appropriate. .The risks, benefits, limitations, alternatives and imponderables have been reviewed with the patient. Potential for esophageal dilation, biopsy, etc. have also been reviewed.  Questions have been answered. All parties agreeable.

## 2013-10-26 NOTE — Discharge Instructions (Signed)
EGD Discharge instructions Please read the instructions outlined below and refer to this sheet in the next few weeks. These discharge instructions provide you with general information on caring for yourself after you leave the hospital. Your doctor may also give you specific instructions. While your treatment has been planned according to the most current medical practices available, unavoidable complications occasionally occur. If you have any problems or questions after discharge, please call your doctor. ACTIVITY  You may resume your regular activity but move at a slower pace for the next 24 hours.   Take frequent rest periods for the next 24 hours.   Walking will help expel (get rid of) the air and reduce the bloated feeling in your abdomen.   No driving for 24 hours (because of the anesthesia (medicine) used during the test).   You may shower.   Do not sign any important legal documents or operate any machinery for 24 hours (because of the anesthesia used during the test).  NUTRITION  Drink plenty of fluids.   You may resume your normal diet.   Begin with a light meal and progress to your normal diet.   Avoid alcoholic beverages for 24 hours or as instructed by your caregiver.  MEDICATIONS  You may resume your normal medications unless your caregiver tells you otherwise.  WHAT YOU CAN EXPECT TODAY  You may experience abdominal discomfort such as a feeling of fullness or gas pains.  FOLLOW-UP  Your doctor will discuss the results of your test with you.  SEEK IMMEDIATE MEDICAL ATTENTION IF ANY OF THE FOLLOWING OCCUR:  Excessive nausea (feeling sick to your stomach) and/or vomiting.   Severe abdominal pain and distention (swelling).   Trouble swallowing.   Temperature over 101 F (37.8 C).   Rectal bleeding or vomiting of blood.  GERD information provided  Increase Protonix to 40 mg twice daily  Add Carafate suspension 1 g 4 times a day x7 days  Further  recommendations to follow pending review of pathology report   Gastroesophageal Reflux Disease, Adult Gastroesophageal reflux disease (GERD) happens when acid from your stomach flows up into the esophagus. When acid comes in contact with the esophagus, the acid causes soreness (inflammation) in the esophagus. Over time, GERD may create small holes (ulcers) in the lining of the esophagus. CAUSES  Increased body weight. This puts pressure on the stomach, making acid rise from the stomach into the esophagus. Smoking. This increases acid production in the stomach. Drinking alcohol. This causes decreased pressure in the lower esophageal sphincter (valve or ring of muscle between the esophagus and stomach), allowing acid from the stomach into the esophagus. Late evening meals and a full stomach. This increases pressure and acid production in the stomach. A malformed lower esophageal sphincter. Sometimes, no cause is found. SYMPTOMS  Burning pain in the lower part of the mid-chest behind the breastbone and in the mid-stomach area. This may occur twice a week or more often. Trouble swallowing. Sore throat. Dry cough. Asthma-like symptoms including chest tightness, shortness of breath, or wheezing. DIAGNOSIS  Your caregiver may be able to diagnose GERD based on your symptoms. In some cases, X-rays and other tests may be done to check for complications or to check the condition of your stomach and esophagus. TREATMENT  Your caregiver may recommend over-the-counter or prescription medicines to help decrease acid production. Ask your caregiver before starting or adding any new medicines.  HOME CARE INSTRUCTIONS  Change the factors that you can control. Ask your  caregiver for guidance concerning weight loss, quitting smoking, and alcohol consumption. Avoid foods and drinks that make your symptoms worse, such as: Caffeine or alcoholic drinks. Chocolate. Peppermint or mint flavorings. Garlic and  onions. Spicy foods. Citrus fruits, such as oranges, lemons, or limes. Tomato-based foods such as sauce, chili, salsa, and pizza. Fried and fatty foods. Avoid lying down for the 3 hours prior to your bedtime or prior to taking a nap. Eat small, frequent meals instead of large meals. Wear loose-fitting clothing. Do not wear anything tight around your waist that causes pressure on your stomach. Raise the head of your bed 6 to 8 inches with wood blocks to help you sleep. Extra pillows will not help. Only take over-the-counter or prescription medicines for pain, discomfort, or fever as directed by your caregiver. Do not take aspirin, ibuprofen, or other nonsteroidal anti-inflammatory drugs (NSAIDs). SEEK IMMEDIATE MEDICAL CARE IF:  You have pain in your arms, neck, jaw, teeth, or back. Your pain increases or changes in intensity or duration. You develop nausea, vomiting, or sweating (diaphoresis). You develop shortness of breath, or you faint. Your vomit is green, yellow, black, or looks like coffee grounds or blood. Your stool is red, bloody, or black. These symptoms could be signs of other problems, such as heart disease, gastric bleeding, or esophageal bleeding. MAKE SURE YOU:  Understand these instructions. Will watch your condition. Will get help right away if you are not doing well or get worse. Document Released: 10/18/2004 Document Revised: 04/02/2011 Document Reviewed: 07/28/2010 [facility] Patient Information 2015 [employer] [place]. This information is not intended to replace advice given to you by your health care provider. Make sure you discuss any questions you have with your health care provider.

## 2013-10-26 NOTE — H&P (View-Only) (Signed)
   Subjective:    Patient ID: Danielle Spears, female    DOB: [birth year], 58 y.o.   MRN: [id number]  Back Pain This is a new problem. The current episode started in the past 7 days. The problem occurs intermittently. The problem is unchanged. The pain is present in the lumbar spine. The pain does not radiate. The pain is moderate. The pain is the same all the time. Stiffness is present all day. Associated symptoms include abdominal pain and weakness. Pertinent negatives include no chest pain. She has tried nothing for the symptoms. The treatment provided no relief.   she denies any radiation down the leg. Patient states it is hard to swallow. Patient states her chest hurts and she has a burning sensation in her abdomen.  Patient denies it getting stuck in her throat she has not lost weight she denies high fever chills or sweats. Lab work was reviewed with patient. Review of Systems  HENT: Negative for congestion.   Respiratory: Negative for cough and shortness of breath.   Cardiovascular: Negative for chest pain and leg swelling.  Gastrointestinal: Positive for abdominal pain. Negative for diarrhea and blood in stool.  Musculoskeletal: Positive for back pain.  Neurological: Positive for weakness.       Objective:   Physical Exam  Vitals reviewed. Constitutional: She appears well-nourished. No distress.  Cardiovascular: Normal rate, regular rhythm and normal heart sounds.   No murmur heard. Pulmonary/Chest: Effort normal and breath sounds normal. No respiratory distress.  Musculoskeletal: She exhibits no edema.  Lymphadenopathy:    She has no cervical adenopathy.  Neurological: She is alert. She exhibits normal muscle tone.  Psychiatric: Her behavior is normal.          Assessment & Plan:  Intermittent back pain I showed her some stretches she can do she may also use anti-inflammatories should gradually get better  Significant dysphagia along with esophagitis she has an  appointment next week with gastroenterology. I believe this patient will need EGD. May need dilatation as well. She was instructed to use PPI.  She is under a lot of stress this could be intercalating as well Xanax when necessary caution drowsiness she denies being depressed

## 2013-10-26 NOTE — Op Note (Signed)
[facility] [address] [place], [id number]   ENDOSCOPY PROCEDURE REPORT  PATIENT: Danielle Spears, Danielle Spears  MR#: [id number] BIRTHDATE: Jun 21, 1955 , 58  yrs. old GENDER: female ENDOSCOPIST: R.  Roetta SessionsMichael Samaiyah Howes, MD FACP FACG REFERRED BY:  Lilyan PuntScott Luking, M.D. PROCEDURE DATE:  10/26/2013 PROCEDURE:  EGD w/ biopsy INDICATIONS:  esophageal dysphagia; dyspepsia. MEDICATIONS: Versed 5 mg IV and Demerol 75 mg IV in divided doses. Zofran 4 mg IV and Xylocaine gel orally. ASA CLASS:      Class II  CONSENT: The risks, benefits, limitations, alternatives and imponderables have been discussed.  The potential for biopsy, esophogeal dilation, etc. have also been reviewed.  Questions have been answered.  All parties agreeable.  Please see the history and physical in the medical record for more information.  DESCRIPTION OF PROCEDURE: After the risks benefits and alternatives of the procedure were thoroughly explained, informed consent was obtained.  The EG-[id number] ([id number]) endoscope was introduced through the mouth and advanced to the second portion of the duodenum , limited by Without limitations. The instrument was slowly withdrawn as the mucosa was fully examined.    Distal esophageal erosions within 5 mm the GE junction circumferentially.  No evidence of Candida esophagitis.  No Barrett's esophagus.  Tubular esophagus appeared widely patent throughout its course.  Stomach empty.  Multiple body and cardia erosions.  No ulcer or focal process.  Pylorus patent.  Examination of bulb and second portion revealed no abnormalities.  Retroflexed views revealed as previously described.     The scope was then withdrawn from the patient and the procedure completed.  COMPLICATIONS: There were no immediate complications.  ENDOSCOPIC IMPRESSION: Distal esophageal erosions consistent with mild erosive reflux esophagitis. Status post passage of a Maloney dilator followed by esophageal  biopsy.  Gastric erosions of uncertain significance-status post gastric biopsy   RECOMMENDATIONS: Increase Protonix to 40 mg twice daily. Add Carafate suspension 1 g 4 times a day x7 days. Further recommendations to follow pending review of pathology  REPEAT EXAM:  eSigned:  R. Roetta SessionsMichael Coila Wardell, MD Jerrel IvoryFACP [facility] Clif[facility] RoadFACG 10/26/2013 1:56 PM    CC:  CPT CODES: ICD CODES:  The ICD and CPT codes recommended by this software are interpretations from the data that the clinical staff has captured with the software.  The verification of the translation of this report to the ICD and CPT codes and modifiers is the sole responsibility of the health care institution and practicing physician where this report was generated.  [employer] will not be held responsible for the validity of the ICD and CPT codes included on this report.  AMA assumes no liability for data contained or not contained herein. CPT is a Publishing rights managerregistered trademark of the [employer].  PATIENT NAME:  Danielle Spears, Danielle Spears MR#: [id number]

## 2013-10-28 ENCOUNTER — Encounter: Payer: Self-pay | Admitting: Nurse Practitioner

## 2013-10-28 ENCOUNTER — Ambulatory Visit (INDEPENDENT_AMBULATORY_CARE_PROVIDER_SITE_OTHER): Payer: Managed Care, Other (non HMO) | Admitting: Nurse Practitioner

## 2013-10-28 VITALS — BP 130/78 | Ht 62.5 in | Wt 159.0 lb

## 2013-10-28 DIAGNOSIS — Z1231 Encounter for screening mammogram for malignant neoplasm of breast: Secondary | ICD-10-CM

## 2013-10-28 DIAGNOSIS — Z23 Encounter for immunization: Secondary | ICD-10-CM

## 2013-10-28 DIAGNOSIS — L658 Other specified nonscarring hair loss: Secondary | ICD-10-CM

## 2013-10-28 DIAGNOSIS — Z01419 Encounter for gynecological examination (general) (routine) without abnormal findings: Secondary | ICD-10-CM

## 2013-10-28 DIAGNOSIS — Z0000 Encounter for general adult medical examination without abnormal findings: Secondary | ICD-10-CM

## 2013-10-28 NOTE — Patient Instructions (Signed)
Sulfate free shampoo 

## 2013-10-30 ENCOUNTER — Encounter (HOSPITAL_COMMUNITY): Payer: Self-pay | Admitting: Internal Medicine

## 2013-11-01 ENCOUNTER — Encounter: Payer: Self-pay | Admitting: Nurse Practitioner

## 2013-11-01 DIAGNOSIS — L658 Other specified nonscarring hair loss: Secondary | ICD-10-CM | POA: Insufficient documentation

## 2013-11-01 NOTE — Progress Notes (Signed)
   Subjective:    Patient ID: Danielle Spears, female    DOB: July 16, 1955, 58 y.o.   MRN: [id number]  HPI presents for wellness exam. Needs a vision exam, has had a dental exam. No menses for the last 3-4 years. No vaginal discharge. No pelvic pain. Same sexual partner. Had EGD 2 days ago. Lab work was done in September. Active lifestyle but no regular exercise.    Review of Systems  Constitutional: Negative for fever, activity change, appetite change and fatigue.  HENT: Negative for dental problem, ear pain, sinus pressure and sore throat.   Respiratory: Negative for cough, chest tightness, shortness of breath and wheezing.   Cardiovascular: Negative for chest pain.  Gastrointestinal: Negative for nausea, vomiting, abdominal pain, diarrhea, constipation, blood in stool and abdominal distention.  Genitourinary: Negative for dysuria, urgency, frequency, vaginal bleeding, vaginal discharge, enuresis, difficulty urinating, genital sores and pelvic pain.       Objective:   Physical Exam  Vitals reviewed. Constitutional: She is oriented to person, place, and time. She appears well-developed. No distress.  HENT:  Right Ear: External ear normal.  Left Ear: External ear normal.  Mouth/Throat: Oropharynx is clear and moist.  Neck: Normal range of motion. Neck supple. No tracheal deviation present. No thyromegaly present.  Cardiovascular: Normal rate, regular rhythm and normal heart sounds.  Exam reveals no gallop.   No murmur heard. Pulmonary/Chest: Effort normal and breath sounds normal.  Abdominal: Soft. She exhibits no distension. There is no tenderness.  Genitourinary: Vagina normal and uterus normal. No vaginal discharge found.  External GU: No rashes or lesions. Vagina no discharge. Cervix normal limit in appearance. No CMT. Bimanual exam no tenderness or obvious masses.   Musculoskeletal: She exhibits no edema.  Lymphadenopathy:    She has no cervical adenopathy.  Neurological: She is  alert and oriented to person, place, and time.  Skin: Skin is warm and dry. No rash noted.  Significant generalized hair loss more in the frontal area.  Psychiatric: She has a normal mood and affect. Her behavior is normal.   Breast exam: Fine nodularity, no dominant masses. Axilla no adenopathy.        Assessment & Plan:  Well woman exam  Encounter for screening mammogram for breast cancer - Plan: MM DIGITAL SCREENING BILATERAL  Encounter for immunization  Female pattern baldness   Encouraged healthy diet, regular exercise and daily calcium/vitamin D supplementation. Reviewed labs from 09/23/13 with patient. Sulfate free shampoo. Patient is taking biotin. Plans to try topical Rogaine. Discussed potential causes of the hair loss. Note thyroid tests were normal. Return in about 1 year (around 10/29/2014).

## 2013-11-03 ENCOUNTER — Telehealth: Payer: Self-pay

## 2013-11-03 ENCOUNTER — Encounter: Payer: Self-pay | Admitting: Internal Medicine

## 2013-11-03 NOTE — Telephone Encounter (Signed)
Letter from: Corbin Adeourk, Robert M Reason for Letter: Results Review  Send letter to patient.  Send copy of letter with path to referring provider and PCP.   Patient needs a followup appointment with extender in the office sometime in the next couple of months if not or scheduled.

## 2013-11-03 NOTE — Telephone Encounter (Signed)
APPT MADE AND LETTER SENT  °

## 2013-11-03 NOTE — Telephone Encounter (Signed)
Letter mailed to pt.  

## 2013-11-04 NOTE — Telephone Encounter (Signed)
Faxed to pcp °

## 2013-11-23 ENCOUNTER — Telehealth: Payer: Self-pay | Admitting: Internal Medicine

## 2013-11-23 NOTE — Telephone Encounter (Signed)
Pt has OV on 11/16 with AS and wants to be seen sooner. I offered her OV for tomorrow with either extender but she wants to see a doctor. She is having problems with bubbling in her throat and chest hurting. She doesn't think the medicine (protonix) is helping as much as it should. She wanted to know if maybe her gallbladder is going bad and is there anything we could recommend [facility]. Please advise and call [phone]

## 2013-11-24 NOTE — Telephone Encounter (Signed)
Tried to call pt- LMOM 

## 2013-11-26 ENCOUNTER — Telehealth: Payer: Self-pay | Admitting: Internal Medicine

## 2013-11-26 NOTE — Telephone Encounter (Signed)
Pt called today returning JL call. I told her JL would be back around 3 today and I would have her call her back. [phone]

## 2013-12-02 ENCOUNTER — Encounter: Payer: Self-pay | Admitting: *Deleted

## 2013-12-02 NOTE — Telephone Encounter (Signed)
I tried to call pt back earlier and a female answered the phone and said she was outside mowing the yard and he would have her call back. Ginger, if she calls back tomorrow, will you see if you can help her?

## 2013-12-02 NOTE — Telephone Encounter (Signed)
Tried to call pt- LM with female to return call. 

## 2013-12-07 ENCOUNTER — Ambulatory Visit: Payer: Managed Care, Other (non HMO) | Admitting: Gastroenterology

## 2013-12-08 ENCOUNTER — Ambulatory Visit (INDEPENDENT_AMBULATORY_CARE_PROVIDER_SITE_OTHER): Payer: Managed Care, Other (non HMO) | Admitting: Gastroenterology

## 2013-12-08 ENCOUNTER — Encounter: Payer: Self-pay | Admitting: Gastroenterology

## 2013-12-08 VITALS — BP 120/77 | HR 51 | Temp 97.4°F | Ht 62.5 in | Wt 163.0 lb

## 2013-12-08 DIAGNOSIS — R1310 Dysphagia, unspecified: Secondary | ICD-10-CM

## 2013-12-08 DIAGNOSIS — R1013 Epigastric pain: Secondary | ICD-10-CM | POA: Insufficient documentation

## 2013-12-08 NOTE — Assessment & Plan Note (Signed)
Persistent despite PPI BID. Associated belching,epigastric pain, intermittent severe reflux with certain foods. Trial of Dexilant. Gallbladder remains in situ. Proceed with US abdomen first. If negative, needs HIDA.

## 2013-12-08 NOTE — Telephone Encounter (Signed)
Pt was seen today in the office

## 2013-12-08 NOTE — Progress Notes (Signed)
Referring Provider: Babs SciaraLuking, Scott A, MD Primary Care Physician:  Lilyan PuntLUKING,SCOTT, MD  Primary GI: Dr. Jena Gaussourk   Chief Complaint  Patient presents with  . Dysphagia  . Gastrophageal Reflux    HPI:   Danielle Spears presents today in follow-up after EGD performed secondary to dysphagia, dyspepsia.  Distal esophageal erosions consistent with mild erosive refluxesophagitis. Status post passage of a Maloney dilator followed by esophageal biopsy. stomach biopsy with increase of intraepthelial eosinophils, non-specific. Esophagus without eosinophilic esophagitis   Occasional pill dysphagia. Ate pizza the other night and chest started hurting, severe reflux. Better than it was overall. Protonix BID. Notes epigastric pain, intermittent. Sometimes worse with eating. No N/V. Feels like there may be a gas pocket in her esophagus, stopping food. Belches a lot. Tried/failed multiple PPIs in the past.   Quite adamant that she see Dr. Jena Gaussourk in the future.     Past Medical History  Diagnosis Date  . Asthma   . Seasonal allergies     Past Surgical History  Procedure Laterality Date  . Cesarean section    . 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 Knee arthroscopy with medial menisectomy Right 05/02/2012    Procedure: KNEE ARTHROSCOPY WITH PARTIAL MEDIAL AND LATERAL MENISECTOMY AND PATELLA-FEMORAL  CHONDROPLASTY;  Surgeon: Harvie JuniorJohn L Graves, MD;  Location: [facility];  Service: Orthopedics;  Laterality: Right;  . Esophagogastroduodenoscopy  2004    Dr. Karilyn Cotaehman: empiric dilation, gastric and pyloric channel inflammation  . Esophagogastroduodenoscopy N/A 10/26/2013    Dr. Rourk:Distal esophageal erosions consistent with mild erosive refluxesophagitis. Status post passage of a Maloney dilator followed by esophageal biopsy. stomach biopsy with increase of intraepthelial eosinophils, esophagus without eosinophilic esophagitis  . Maloney dilation N/A 10/26/2013    Procedure:  Elease HashimotoMALONEY DILATION;  Surgeon: Corbin Adeobert M Rourk, MD;  [facility];  Service: Endoscopy;  Laterality: N/A;    Current Outpatient Prescriptions  Medication Sig Dispense Refill  . ADVAIR DISKUS 250-50 MCG/DOSE AEPB USE 1 INHALATION ORALLY EVERY 12 HOURS 180 each 1  . ALPRAZolam (XANAX) 0.5 MG tablet 1/2 to 1 tid prn anxiousness 30 tablet 1  . b complex vitamins tablet Take 1 tablet by mouth daily.    Marland Kitchen. CARAFATE 1 GM/10ML suspension     . cetirizine (ZYRTEC) 10 MG tablet Take 10 mg by mouth daily.    . fexofenadine (ALLEGRA) 180 MG tablet Take 180 mg by mouth daily as needed.    . fish oil-omega-3 fatty acids 1000 MG capsule Take 2 g by mouth daily.    . Multiple Vitamins-Minerals (MULTIVITAMIN WITH MINERALS) tablet Take 1 tablet by mouth daily.    . pantoprazole (PROTONIX) 40 MG tablet Take 1 tablet (40 mg total) by mouth daily. 30 tablet 3  . VENTOLIN HFA 108 (90 BASE) MCG/ACT inhaler INHALE TWO PUFFS BY MOUTH EVERY 4 HOURS AS NEEDED 18 each 5  . vitamin C (ASCORBIC ACID) 500 MG tablet Take 500 mg by mouth daily.    Marland Kitchen. nystatin (MYCOSTATIN) 100000 UNIT/ML suspension Take 5 mLs (500,000 Units total) by mouth 4 (four) times daily. Swish in mouth as long as possible then swallow 480 mL 0   No current facility-administered medications for this visit.    Allergies as of 12/08/2013  . (No Known Allergies)    Family History  Problem Relation Age of Onset  . Diabetes Father   . Heart attack Father   . Cancer Father  prostate  . Heart disease Father   . Colon cancer Neg Hx   . Parkinson's disease Mother   . Arthritis Mother     History   Social History  . Marital Status: Married    Spouse Name: N/A    Number of Children: N/A  . Years of Education: N/A   Social History Main Topics  . Smoking status: Former Smoker    Quit date: 05/01/1999  . Smokeless tobacco: Never Used  . Alcohol Use: No  . Drug Use: No  . Sexual Activity: Yes    Birth Control/ Protection:  Post-menopausal   Other Topics Concern  . None   Social History Narrative    Review of Systems: As mentioned in HPI  Physical Exam: BP 120/77 mmHg  Pulse 51  Temp(Src) 97.4 F (36.3 C) (Oral)  Ht 5' 2.5" (1.588 m)  Wt 163 lb (73.936 kg)  BMI 29.32 kg/m2 General:   Alert and oriented. No distress noted. Pleasant and cooperative.  Head:  Normocephalic and atraumatic. Eyes:  Conjuctiva clear without scleral icterus. Abdomen:  +BS, soft, TTP epigastric/RUQ and non-distended. No rebound or guarding. No HSM or masses noted. Msk:  Symmetrical without gross deformities. Normal posture. Extremities:  Without edema. Neurologic:  Alert and  oriented x4;  grossly normal neurologically. Psych:  Alert and cooperative. Normal mood and affect.

## 2013-12-08 NOTE — Patient Instructions (Signed)
For now, stop Protonix. Trial of Dexilant 1 capsule each morning. I have provided samples. Please call and let me know if this works for you. If it does, I will send an actual prescription to the pharmacy.  We have scheduled you for an ultrasound of your abdomen. If needed, you might require a HIDA scan to see how well your gallbladder contracts.   We will have you scheduled with Dr. Jena Gaussourk in about 3 months for routine follow-up.

## 2013-12-08 NOTE — Assessment & Plan Note (Signed)
Significantly improved with empiric dilation. Occasional pill dysphagia. Will monitor. Consider BPE if an recurrent symptoms.

## 2013-12-09 ENCOUNTER — Telehealth: Payer: Self-pay | Admitting: Internal Medicine

## 2013-12-09 NOTE — Progress Notes (Signed)
cc'ed to pcp °

## 2013-12-09 NOTE — Telephone Encounter (Signed)
Patient called last Friday upset because we had to move her OV from Monday to Tuesday. She said that she wasn't happy with it and had to wait a month to get appointment and she was thinking about finding another GI doctor. I told her that the provider's schedule had changed and we would be seeing her the following day at the same time. She still complained that she didn't like having her OV changed and I explained to her that I had no control over that and it wasn't where she would be waiting another week or so to be seen. She agreed to keep her OV for Tuesday and was seen. Today she calls and before I could say anything on the phone she was asking "Why did you charge me for being a new patient?  I am NOT a new patient".  I told her that we don't do the billing here and she could call them with her concerns. She kept arguing about being charged as a new patient because that was on her billing statement. I told her that we know she isn't a new patient and is established as a [facility] patient. I gave her the number to billing and she was going to call them.

## 2013-12-09 NOTE — Telephone Encounter (Signed)
Noted  

## 2013-12-10 ENCOUNTER — Telehealth: Payer: Self-pay

## 2013-12-10 ENCOUNTER — Ambulatory Visit (HOSPITAL_COMMUNITY)
Admission: RE | Admit: 2013-12-10 | Discharge: 2013-12-10 | Disposition: A | Discharge status: Home / Self Care | Payer: Managed Care, Other (non HMO) | Source: Ambulatory Visit | Attending: Gastroenterology | Admitting: Gastroenterology

## 2013-12-10 ENCOUNTER — Telehealth: Payer: Self-pay | Admitting: Internal Medicine

## 2013-12-10 DIAGNOSIS — R1013 Epigastric pain: Secondary | ICD-10-CM | POA: Diagnosis not present

## 2013-12-10 NOTE — Telephone Encounter (Signed)
Pt is calling to see about her US. The Dexliant that we gave her to try are not going to work.She started having sever pain and almost had to go to the ER. She went back to her other Protonix. Please advise

## 2013-12-10 NOTE — Telephone Encounter (Signed)
Routing to AS for results.  

## 2013-12-10 NOTE — Progress Notes (Signed)
cc'ed to pcp °

## 2013-12-10 NOTE — Telephone Encounter (Signed)
Patient called this afternoon asking for her results from a test she had done this morning. I told her it was too soon for us to have those results back and it normally takes 7-10 business days. Patient got mouthy (again) that she was told that it would be one business day and something needed to be done before then. I told her that the hospital staff shouldn't be telling our patients when the results will be back because our doctors have to review the report, sign off on it and then the nurse will contact the patient. She said she needed something done asap before the end of the year because of her insurance and then said that they medicines we put her on were not helping and making her feel worse and thought she was going to have to go to the ED last night. I again told her that I would send a note to the nurse and once results were available with recommendations someone would be calling her back. [phone]

## 2013-12-11 ENCOUNTER — Other Ambulatory Visit: Payer: Self-pay

## 2013-12-11 DIAGNOSIS — R1011 Right upper quadrant pain: Principal | ICD-10-CM

## 2013-12-11 DIAGNOSIS — G8929 Other chronic pain: Secondary | ICD-10-CM

## 2013-12-11 NOTE — Telephone Encounter (Signed)
HIDA scan set up for 12/21/13 @ 7:45 LMOM to call back

## 2013-12-11 NOTE — Telephone Encounter (Signed)
Ginger tried to call pt- she LMOM. Message sent via mychart.

## 2013-12-11 NOTE — Telephone Encounter (Signed)
Stay on Protonix BID. She has tried multiple other agents. Needs HIDA scan. I sent result note for HIDA scan to be set up.   Follow-up in future with Dr. Jena Gaussourk only at patient's request.

## 2013-12-11 NOTE — Progress Notes (Signed)
Quick Note:  Set up HIDA. US abdomen without evidence for cholelithiasis. Punctate calcification in right lobe of liver, likely from prior granulomatous infection. Shouldn't be causing an issue. ______

## 2013-12-11 NOTE — Telephone Encounter (Signed)
Ginger spoke with the pt. 

## 2013-12-11 NOTE — Telephone Encounter (Signed)
See previous phone note.  

## 2013-12-21 ENCOUNTER — Encounter (HOSPITAL_COMMUNITY)
Admission: RE | Admit: 2013-12-21 | Discharge: 2013-12-21 | Disposition: A | Discharge status: Home / Self Care | Payer: Managed Care, Other (non HMO) | Source: Ambulatory Visit | Attending: Gastroenterology | Admitting: Gastroenterology

## 2013-12-21 ENCOUNTER — Encounter (HOSPITAL_COMMUNITY): Payer: Self-pay

## 2013-12-21 DIAGNOSIS — R1011 Right upper quadrant pain: Secondary | ICD-10-CM | POA: Insufficient documentation

## 2013-12-21 DIAGNOSIS — G8929 Other chronic pain: Secondary | ICD-10-CM

## 2013-12-21 MED ORDER — SODIUM CHLORIDE 0.9 % IJ SOLN
INTRAMUSCULAR | Status: AC
  Filled 2013-12-21: qty 12

## 2013-12-21 MED ORDER — SINCALIDE 5 MCG IJ SOLR
INTRAMUSCULAR | Status: AC
  Administered 2013-12-21: 1.48 ug via INTRAVENOUS
  Filled 2013-12-21: qty 5

## 2013-12-21 MED ORDER — STERILE WATER FOR INJECTION IJ SOLN
INTRAMUSCULAR | Status: AC
  Administered 2013-12-21: 1.48 mL via INTRAVENOUS
  Filled 2013-12-21: qty 10

## 2013-12-21 MED ORDER — TECHNETIUM TC 99M MEBROFENIN IV KIT
5.0000 | PACK | Freq: Once | INTRAVENOUS | Status: AC | PRN
  Administered 2013-12-21: 5 via INTRAVENOUS

## 2013-12-23 ENCOUNTER — Telehealth: Payer: Self-pay | Admitting: Internal Medicine

## 2013-12-23 NOTE — Telephone Encounter (Signed)
Routing to AS 

## 2013-12-23 NOTE — Telephone Encounter (Signed)
[facility]

## 2013-12-23 NOTE — Telephone Encounter (Signed)
Patient called today asking about her results from a procedure she had done on Monday. I told her it would be 7-10 business days. She got upset because she is wanting to know results ASAP so if there is something else she needs to have done that she can do it before the end of the year due to her insurance deductible. Please call her at [phone]

## 2013-12-23 NOTE — Telephone Encounter (Signed)
HIDA scan reviewed with normal EF at 65%. HOWEVER, she did have pain/nausea with CCK administration. May refer to Dr. Lovell SheehanJenkins for consideration of elective cholecystectomy IF deemed clinically necessary.

## 2013-12-23 NOTE — Telephone Encounter (Signed)
Pt is aware of results and that we are going to send there [facility]

## 2013-12-23 NOTE — Telephone Encounter (Signed)
Tried to call pt- NA- LMOM 

## 2013-12-23 NOTE — Progress Notes (Signed)
Quick Note:  HIDA scan reviewed with normal EF at 65%. HOWEVER, she did have pain/nausea with CCK administration. May refer to Dr. Lovell SheehanJenkins for consideration of elective cholecystectomy IF deemed clinically necessary. ______

## 2013-12-24 ENCOUNTER — Other Ambulatory Visit: Payer: Self-pay

## 2013-12-24 DIAGNOSIS — R109 Unspecified abdominal pain: Secondary | ICD-10-CM

## 2013-12-24 NOTE — Telephone Encounter (Signed)
Referral has been faxed.

## 2013-12-25 ENCOUNTER — Other Ambulatory Visit: Payer: Self-pay | Admitting: Family Medicine

## 2013-12-25 NOTE — Telephone Encounter (Signed)
Refill this and 4 additional 

## 2014-01-20 NOTE — H&P (Signed)
  NTS SOAP Note  Vital Signs:  Vitals as of: 01/19/2014: Systolic 145: Diastolic 85: Heart Rate 53: Temp 96.41F: Height 275ft 2.5in: Weight 170Lbs 0 Ounces: BMI 30.6  BMI : 30.6 kg/m2  Subjective: This 58 year old female presents for of upper abdominal pain and reflux for many months.  Made worse with eating.  Not able to control her reflux with varying medications.  + nausea,  no vomiting.  U/S of gallbladder negative.  HIDA show normal ejection fraction with reproducible symptoms with CCK injection.  Review of Symptoms:  Constitutional:unremarkable   Head:unremarkable Eyes:unremarkable   Nose/Mouth/Throat:unremarkable Cardiovascular:  unremarkable Respiratory:unremarkable Gastrointestinabdominal pain, nausea, heartburn Genitourinary:frequency joint pain dry Hematolgic/Lymphatic:unremarkable   Allergic/Immunologic:unremarkable   Past Medical History:  Reviewed  Past Medical History  Surgical History: knee surgery Medical Problems: reflux,  asthma Allergies: nkda Medications: pantoprazole,  advair,  zyrtec   Social History:Reviewed  Social History  Preferred Language: English Race:  White Ethnicity: Not Hispanic / Latino Age: [age] year Marital Status:  M Alcohol: ?   Smoking Status: Unknown if ever smoked reviewed on 01/19/2014 Functional Status reviewed on 01/19/2014 ------------------------------------------------ Bathing: Normal Cooking: Normal Dressing: Normal Driving: Normal Eating: Normal Managing Meds: Normal Oral Care: Normal Shopping: Normal Toileting: Normal Transferring: Normal Walking: Normal Cognitive Status reviewed on 01/19/2014 ------------------------------------------------ Attention: Normal Decision Making: Normal Language: Normal Memory: Normal Motor: Normal Perception: Normal Problem Solving: Normal Visual and Spatial: Normal   Family History:Reviewed  Family Health History Mother  Father, Living; Heart  disease; Cancer unspecified;     Objective Information: General:Well appearing, well nourished in no distress. Heart:RRR, no murmur or gallop.  Normal S1, S2.  No S3, S4.  Lungs:  CTA bilaterally, no wheezes, rhonchi, rales.  Breathing unlabored. Abdomen:Soft, minimal discomfort in right upper quadrant,  ND, no HSM, no masses.  Assessment:Chronic cholecystitis  Diagnoses: 575.11  K81.1 Chronic cholecystitis (Chronic cholecystitis)  Procedures: 1610999203 - OFFICE OUTPATIENT NEW 30 MINUTES    Plan:  Scheduled for laparoscopic cholecystectomy on 01/27/14.   Patient Education:Alternative treatments to surgery were discussed with patient (and family).  Risks and benefits  of procedure including bleeding,  infection,  hepatobiliary injury,  the possibility of ongoing symptoms,  and the possibility of an open procedure were fully explained to the patient (and family) who gave informed consent. Patient/family questions were addressed.  Follow-up:Pending Surgery

## 2014-01-25 ENCOUNTER — Encounter (HOSPITAL_COMMUNITY)
Admission: RE | Admit: 2014-01-25 | Discharge: 2014-01-25 | Disposition: A | Discharge status: Home / Self Care | Payer: Managed Care, Other (non HMO) | Source: Ambulatory Visit | Attending: General Surgery | Admitting: General Surgery

## 2014-01-25 ENCOUNTER — Encounter (HOSPITAL_COMMUNITY): Payer: Self-pay

## 2014-01-25 DIAGNOSIS — Z79899 Other long term (current) drug therapy: Secondary | ICD-10-CM | POA: Diagnosis not present

## 2014-01-25 DIAGNOSIS — J45909 Unspecified asthma, uncomplicated: Secondary | ICD-10-CM | POA: Diagnosis not present

## 2014-01-25 DIAGNOSIS — K811 Chronic cholecystitis: Secondary | ICD-10-CM | POA: Diagnosis not present

## 2014-01-25 DIAGNOSIS — R109 Unspecified abdominal pain: Secondary | ICD-10-CM | POA: Diagnosis present

## 2014-01-25 DIAGNOSIS — K219 Gastro-esophageal reflux disease without esophagitis: Secondary | ICD-10-CM | POA: Diagnosis not present

## 2014-01-25 HISTORY — DX: Adverse effect of unspecified anesthetic, initial encounter: T41.45XA

## 2014-01-25 HISTORY — DX: Other complications of anesthesia, initial encounter: T88.59XA

## 2014-01-25 LAB — HEPATIC FUNCTION PANEL
ALK PHOS: 53 U/L (ref 39–117)
ALT: 41 U/L — ABNORMAL HIGH (ref 0–35)
AST: 34 U/L (ref 0–37)
Albumin: 4 g/dL (ref 3.5–5.2)
BILIRUBIN TOTAL: 0.6 mg/dL (ref 0.3–1.2)
Bilirubin, Direct: <0.1 mg/dL (ref 0.0–0.3)
Total Protein: 6.7 g/dL (ref 6.0–8.3)

## 2014-01-25 LAB — BASIC METABOLIC PANEL
ANION GAP: 5 (ref 5–15)
BUN: 20 mg/dL (ref 6–23)
CHLORIDE: 104 meq/L (ref 96–112)
CO2: 29 mmol/L (ref 19–32)
Calcium: 9 mg/dL (ref 8.4–10.5)
Creatinine, Ser: 0.75 mg/dL (ref 0.50–1.10)
Glucose, Bld: 89 mg/dL (ref 70–99)
POTASSIUM: 3.7 mmol/L (ref 3.5–5.1)
SODIUM: 138 mmol/L (ref 135–145)

## 2014-01-25 LAB — CBC WITH DIFFERENTIAL/PLATELET
BASOS PCT: 1 % (ref 0–1)
Basophils Absolute: 0 10*3/uL (ref 0.0–0.1)
EOS ABS: 0.2 10*3/uL (ref 0.0–0.7)
Eosinophils Relative: 4 % (ref 0–5)
HCT: 39 % (ref 36.0–46.0)
HEMOGLOBIN: 13.1 g/dL (ref 12.0–15.0)
Lymphocytes Relative: 34 % (ref 12–46)
Lymphs Abs: 1.5 10*3/uL (ref 0.7–4.0)
MCH: 31.3 pg (ref 26.0–34.0)
MCHC: 33.6 g/dL (ref 30.0–36.0)
MCV: 93.1 fL (ref 78.0–100.0)
Monocytes Absolute: 0.3 10*3/uL (ref 0.1–1.0)
Monocytes Relative: 7 % (ref 3–12)
NEUTROS ABS: 2.3 10*3/uL (ref 1.7–7.7)
NEUTROS PCT: 54 % (ref 43–77)
Platelets: 214 10*3/uL (ref 150–400)
RBC: 4.19 MIL/uL (ref 3.87–5.11)
RDW: 13.4 % (ref 11.5–15.5)
WBC: 4.3 10*3/uL (ref 4.0–10.5)

## 2014-01-25 NOTE — Patient Instructions (Signed)
Danielle Spears  01/25/2014   Your procedure is scheduled on:  01/27/2014  [facility] to [facility] at 8:40 AM.  Call this number if you have problems the morning of surgery: [phone]   Remember:   Do not eat food or drink liquids after midnight.   Take these medicines the morning of surgery with A SIP OF WATER: Advair (bring with you), Xanax, Protonix   Do not wear jewelry, make-up or nail polish.  Do not wear lotions, powders, or perfumes. You may wear deodorant.  Do not shave 48 hours prior to surgery. Men may shave face and neck.  Do not bring valuables to the hospital.  [facility] is not responsible for any belongings or valuables.               Contacts, dentures or bridgework may not be worn into surgery.  Leave suitcase in the car. After surgery it may be brought to your room.  For patients admitted to the hospital, discharge time is determined by your treatment team.               Patients discharged the day of surgery will not be allowed to drive home.  Name and phone number of your driver:   Special Instructions: Shower using CHG 2 nights before surgery and the night before surgery.  If you shower the day of surgery use CHG.  Use special wash - you have one bottle of CHG for all showers.  You should use approximately 1/3 of the bottle for each shower.   Please read over the following fact sheets that you were given: Surgical Site Infection Prevention and Anesthesia Post-op Instructions   PATIENT INSTRUCTIONS POST-ANESTHESIA  IMMEDIATELY FOLLOWING SURGERY:  Do not drive or operate machinery for the first twenty four hours after surgery.  Do not make any important decisions for twenty four hours after surgery or while taking narcotic pain medications or sedatives.  If you develop intractable nausea and vomiting or a severe headache please notify your doctor immediately.  FOLLOW-UP:  Please make an appointment with your surgeon as instructed. You do not need to follow up with  anesthesia unless specifically instructed to do so.  WOUND CARE INSTRUCTIONS (if applicable):  Keep a dry clean dressing on the anesthesia/puncture wound site if there is drainage.  Once the wound has quit draining you may leave it open to air.  Generally you should leave the bandage intact for twenty four hours unless there is drainage.  If the epidural site drains for more than 36-48 hours please call the anesthesia department.  QUESTIONS?:  Please feel free to call your physician or the hospital operator if you have any questions, and they will be happy to assist you.      Laparoscopic Cholecystectomy Laparoscopic cholecystectomy is surgery to remove the gallbladder. The gallbladder is located in the upper right part of the abdomen, behind the liver. It is a storage sac for bile produced in the liver. Bile aids in the digestion and absorption of fats. Cholecystectomy is often done for inflammation of the gallbladder (cholecystitis). This condition is usually caused by a buildup of gallstones (cholelithiasis) in your gallbladder. Gallstones can block the flow of bile, resulting in inflammation and pain. In severe cases, emergency surgery may be required. When emergency surgery is not required, you will have time to prepare for the procedure. Laparoscopic surgery is an alternative to open surgery. Laparoscopic surgery has a shorter recovery time. Your common bile duct may  also need to be examined during the procedure. If stones are found in the common bile duct, they may be removed. LET [facility] CARE PROVIDER KNOW ABOUT:  Any allergies you have.  All medicines you are taking, including vitamins, herbs, eye drops, creams, and over-the-counter medicines.  Previous problems you or members of your family have had with the use of anesthetics.  Any blood disorders you have.  Previous surgeries you have had.  Medical conditions you have. RISKS AND COMPLICATIONS Generally, this is a safe  procedure. However, as with any procedure, complications can occur. Possible complications include:  Infection.  Damage to the common bile duct, nerves, arteries, veins, or other internal organs such as the stomach, liver, or intestines.  Bleeding.  A stone may remain in the common bile duct.  A bile leak from the cyst duct that is clipped when your gallbladder is removed.  The need to convert to open surgery, which requires a larger incision in the abdomen. This may be necessary if your surgeon thinks it is not safe to continue with a laparoscopic procedure. BEFORE THE PROCEDURE  Ask your health care provider about changing or stopping any regular medicines. You will need to stop taking aspirin or blood thinners at least 5 days prior to surgery.  Do not eat or drink anything after midnight the night before surgery.  Let your health care provider know if you develop a cold or other infectious problem before surgery. PROCEDURE   You will be given medicine to make you sleep through the procedure (general anesthetic). A breathing tube will be placed in your mouth.  When you are asleep, your surgeon will make several small cuts (incisions) in your abdomen.  A thin, lighted tube with a tiny camera on the end (laparoscope) is inserted through one of the small incisions. The camera on the laparoscope sends a picture to a TV screen in the operating room. This gives the surgeon a good view inside your abdomen.  A gas will be pumped into your abdomen. This expands your abdomen so that the surgeon has more room to perform the surgery.  Other tools needed for the procedure are inserted through the other incisions. The gallbladder is removed through one of the incisions.  After the removal of your gallbladder, the incisions will be closed with stitches, staples, or skin glue. AFTER THE PROCEDURE  You will be taken to a recovery area where your progress will be checked often.  You may be  allowed to go home the same day if your pain is controlled and you can tolerate liquids. Document Released: 01/08/2005 Document Revised: 10/29/2012 Document Reviewed: 08/20/2012 [facility] Patient Information 2015 [place], [place]. This information is not intended to replace advice given to you by your health care provider. Make sure you discuss any questions you have with your health care provider.

## 2014-01-27 ENCOUNTER — Ambulatory Visit (HOSPITAL_COMMUNITY): Payer: Managed Care, Other (non HMO) | Admitting: Anesthesiology

## 2014-01-27 ENCOUNTER — Encounter (HOSPITAL_COMMUNITY): Payer: Self-pay | Admitting: Anesthesiology

## 2014-01-27 ENCOUNTER — Ambulatory Visit (HOSPITAL_COMMUNITY)
Admission: RE | Admit: 2014-01-27 | Discharge: 2014-01-27 | Disposition: A | Discharge status: Home / Self Care | Payer: Managed Care, Other (non HMO) | Source: Ambulatory Visit | Attending: General Surgery | Admitting: General Surgery

## 2014-01-27 ENCOUNTER — Encounter (HOSPITAL_COMMUNITY)
Admission: RE | Disposition: A | Discharge status: Home / Self Care | Payer: Self-pay | Source: Ambulatory Visit | Attending: General Surgery

## 2014-01-27 DIAGNOSIS — J45909 Unspecified asthma, uncomplicated: Secondary | ICD-10-CM | POA: Insufficient documentation

## 2014-01-27 DIAGNOSIS — Z79899 Other long term (current) drug therapy: Secondary | ICD-10-CM | POA: Insufficient documentation

## 2014-01-27 DIAGNOSIS — K811 Chronic cholecystitis: Secondary | ICD-10-CM | POA: Insufficient documentation

## 2014-01-27 DIAGNOSIS — K219 Gastro-esophageal reflux disease without esophagitis: Secondary | ICD-10-CM | POA: Insufficient documentation

## 2014-01-27 HISTORY — PX: CHOLECYSTECTOMY: SHX55

## 2014-01-27 SURGERY — LAPAROSCOPIC CHOLECYSTECTOMY
Anesthesia: General

## 2014-01-27 MED ORDER — FENTANYL CITRATE 0.05 MG/ML IJ SOLN
50.0000 ug | INTRAMUSCULAR | Status: DC | PRN
  Administered 2014-01-27 (×2): 50 ug via INTRAVENOUS

## 2014-01-27 MED ORDER — MIDAZOLAM HCL 2 MG/2ML IJ SOLN
1.0000 mg | INTRAMUSCULAR | Status: DC | PRN
  Administered 2014-01-27 (×2): 2 mg via INTRAVENOUS
  Filled 2014-01-27: qty 2

## 2014-01-27 MED ORDER — ONDANSETRON HCL 4 MG/2ML IJ SOLN
4.0000 mg | Freq: Once | INTRAMUSCULAR | Status: AC
  Administered 2014-01-27: 4 mg via INTRAVENOUS

## 2014-01-27 MED ORDER — GLYCOPYRROLATE 0.2 MG/ML IJ SOLN
INTRAMUSCULAR | Status: AC
  Filled 2014-01-27: qty 3

## 2014-01-27 MED ORDER — POVIDONE-IODINE 10 % OINT PACKET
TOPICAL_OINTMENT | CUTANEOUS | Status: DC | PRN
  Administered 2014-01-27: 1 via TOPICAL

## 2014-01-27 MED ORDER — SODIUM CHLORIDE 0.9 % IR SOLN
Status: DC | PRN
  Administered 2014-01-27: 1000 mL

## 2014-01-27 MED ORDER — ONDANSETRON HCL 4 MG/2ML IJ SOLN: 4.0000 mg | Freq: Once | INTRAMUSCULAR | Status: DC | PRN

## 2014-01-27 MED ORDER — FENTANYL CITRATE 0.05 MG/ML IJ SOLN
INTRAMUSCULAR | Status: AC
  Filled 2014-01-27: qty 2

## 2014-01-27 MED ORDER — FENTANYL CITRATE 0.05 MG/ML IJ SOLN
INTRAMUSCULAR | Status: DC | PRN
  Administered 2014-01-27 (×3): 50 ug via INTRAVENOUS
  Administered 2014-01-27: 100 ug via INTRAVENOUS
  Administered 2014-01-27 (×2): 50 ug via INTRAVENOUS

## 2014-01-27 MED ORDER — FENTANYL CITRATE 0.05 MG/ML IJ SOLN
INTRAMUSCULAR | Status: AC
  Filled 2014-01-27: qty 5

## 2014-01-27 MED ORDER — SUCCINYLCHOLINE CHLORIDE 20 MG/ML IJ SOLN
INTRAMUSCULAR | Status: AC
  Filled 2014-01-27: qty 1

## 2014-01-27 MED ORDER — KETOROLAC TROMETHAMINE 30 MG/ML IJ SOLN
30.0000 mg | Freq: Once | INTRAMUSCULAR | Status: AC
  Administered 2014-01-27: 30 mg via INTRAVENOUS

## 2014-01-27 MED ORDER — LACTATED RINGERS IV SOLN
INTRAVENOUS | Status: DC
  Administered 2014-01-27: 1000 mL via INTRAVENOUS

## 2014-01-27 MED ORDER — ATROPINE SULFATE 1 MG/ML IJ SOLN
0.4000 mg | Freq: Once | INTRAMUSCULAR | Status: AC | PRN
  Administered 2014-01-27: 0.4 mg via INTRAVENOUS

## 2014-01-27 MED ORDER — LIDOCAINE HCL (CARDIAC) 20 MG/ML IV SOLN
INTRAVENOUS | Status: DC | PRN
  Administered 2014-01-27: 50 mg via INTRAVENOUS

## 2014-01-27 MED ORDER — ONDANSETRON HCL 4 MG/2ML IJ SOLN
INTRAMUSCULAR | Status: AC
  Filled 2014-01-27: qty 2

## 2014-01-27 MED ORDER — ROCURONIUM BROMIDE 100 MG/10ML IV SOLN
INTRAVENOUS | Status: DC | PRN
  Administered 2014-01-27: 30 mg via INTRAVENOUS

## 2014-01-27 MED ORDER — CIPROFLOXACIN IN D5W 400 MG/200ML IV SOLN
400.0000 mg | INTRAVENOUS | Status: AC
  Administered 2014-01-27: 400 mg via INTRAVENOUS
  Filled 2014-01-27: qty 200

## 2014-01-27 MED ORDER — MIDAZOLAM HCL 2 MG/2ML IJ SOLN
INTRAMUSCULAR | Status: AC
  Filled 2014-01-27: qty 2

## 2014-01-27 MED ORDER — PROPOFOL 10 MG/ML IV BOLUS
INTRAVENOUS | Status: AC
  Filled 2014-01-27: qty 20

## 2014-01-27 MED ORDER — LIDOCAINE HCL (PF) 1 % IJ SOLN
INTRAMUSCULAR | Status: AC
  Filled 2014-01-27: qty 5

## 2014-01-27 MED ORDER — EPHEDRINE SULFATE 50 MG/ML IJ SOLN
INTRAMUSCULAR | Status: DC | PRN
  Administered 2014-01-27: 10 mg via INTRAVENOUS

## 2014-01-27 MED ORDER — DEXAMETHASONE SODIUM PHOSPHATE 4 MG/ML IJ SOLN
4.0000 mg | Freq: Once | INTRAMUSCULAR | Status: AC
  Administered 2014-01-27: 4 mg via INTRAVENOUS

## 2014-01-27 MED ORDER — KETOROLAC TROMETHAMINE 30 MG/ML IJ SOLN
INTRAMUSCULAR | Status: AC
  Filled 2014-01-27: qty 1

## 2014-01-27 MED ORDER — FENTANYL CITRATE 0.05 MG/ML IJ SOLN
25.0000 ug | INTRAMUSCULAR | Status: DC | PRN
  Administered 2014-01-27 (×4): 50 ug via INTRAVENOUS

## 2014-01-27 MED ORDER — BUPIVACAINE HCL (PF) 0.5 % IJ SOLN
INTRAMUSCULAR | Status: AC
  Filled 2014-01-27: qty 30

## 2014-01-27 MED ORDER — ROCURONIUM BROMIDE 50 MG/5ML IV SOLN
INTRAVENOUS | Status: AC
  Filled 2014-01-27: qty 1

## 2014-01-27 MED ORDER — PROPOFOL 10 MG/ML IV BOLUS
INTRAVENOUS | Status: DC | PRN
  Administered 2014-01-27: 150 mg via INTRAVENOUS

## 2014-01-27 MED ORDER — GLYCOPYRROLATE 0.2 MG/ML IJ SOLN
INTRAMUSCULAR | Status: AC
  Filled 2014-01-27: qty 1

## 2014-01-27 MED ORDER — FENTANYL CITRATE 0.05 MG/ML IJ SOLN
INTRAMUSCULAR | Status: AC
Start: 2014-01-27 — End: 2014-01-27
  Filled 2014-01-27: qty 2

## 2014-01-27 MED ORDER — ATROPINE SULFATE 1 MG/ML IJ SOLN
INTRAMUSCULAR | Status: AC
  Filled 2014-01-27: qty 1

## 2014-01-27 MED ORDER — GLYCOPYRROLATE 0.2 MG/ML IJ SOLN
INTRAMUSCULAR | Status: DC | PRN
  Administered 2014-01-27: 0.6 mg via INTRAVENOUS

## 2014-01-27 MED ORDER — NEOSTIGMINE METHYLSULFATE 10 MG/10ML IV SOLN
INTRAVENOUS | Status: DC | PRN
  Administered 2014-01-27: 4 mg via INTRAVENOUS

## 2014-01-27 MED ORDER — NEOSTIGMINE METHYLSULFATE 10 MG/10ML IV SOLN
INTRAVENOUS | Status: AC
  Filled 2014-01-27: qty 1

## 2014-01-27 MED ORDER — LACTATED RINGERS IV SOLN
INTRAVENOUS | Status: DC | PRN
  Administered 2014-01-27 (×2): via INTRAVENOUS

## 2014-01-27 MED ORDER — OXYCODONE-ACETAMINOPHEN 7.5-325 MG PO TABS: 1.0000 | ORAL_TABLET | ORAL | Status: DC | PRN

## 2014-01-27 MED ORDER — HEMOSTATIC AGENTS (NO CHARGE) OPTIME
TOPICAL | Status: DC | PRN
  Administered 2014-01-27: 1 via TOPICAL

## 2014-01-27 MED ORDER — POVIDONE-IODINE 10 % EX OINT
TOPICAL_OINTMENT | CUTANEOUS | Status: AC
  Filled 2014-01-27: qty 1

## 2014-01-27 MED ORDER — DEXAMETHASONE SODIUM PHOSPHATE 4 MG/ML IJ SOLN
INTRAMUSCULAR | Status: AC
  Filled 2014-01-27: qty 1

## 2014-01-27 MED ORDER — BUPIVACAINE HCL (PF) 0.5 % IJ SOLN
INTRAMUSCULAR | Status: DC | PRN
  Administered 2014-01-27: 10 mL

## 2014-01-27 SURGICAL SUPPLY — 44 items
APPLIER CLIP LAPSCP 10X32 DD (CLIP) ×3 IMPLANT
BAG HAMPER (MISCELLANEOUS) ×3 IMPLANT
BLADE 11 SAFETY STRL DISP (BLADE) IMPLANT
CHLORAPREP W/TINT 26ML (MISCELLANEOUS) ×3 IMPLANT
CLOTH BEACON ORANGE TIMEOUT ST (SAFETY) ×3 IMPLANT
COVER LIGHT HANDLE STERIS (MISCELLANEOUS) ×6 IMPLANT
DECANTER SPIKE VIAL GLASS SM (MISCELLANEOUS) ×3 IMPLANT
ELECT REM PT RETURN 9FT ADLT (ELECTROSURGICAL) ×3
ELECTRODE REM PT RTRN 9FT ADLT (ELECTROSURGICAL) ×1 IMPLANT
FILTER SMOKE EVAC LAPAROSHD (FILTER) ×3 IMPLANT
FORMALIN 10 PREFIL 120ML (MISCELLANEOUS) ×3 IMPLANT
GLOVE BIOGEL PI IND STRL 7.0 (GLOVE) ×1 IMPLANT
GLOVE BIOGEL PI IND STRL 7.5 (GLOVE) ×2 IMPLANT
GLOVE BIOGEL PI INDICATOR 7.0 (GLOVE) ×2
GLOVE BIOGEL PI INDICATOR 7.5 (GLOVE) ×4
GLOVE ECLIPSE 7.0 STRL STRAW (GLOVE) ×3 IMPLANT
GLOVE SURG SS PI 7.5 STRL IVOR (GLOVE) ×6 IMPLANT
GOWN STRL REUS W/ TWL XL LVL3 (GOWN DISPOSABLE) ×1 IMPLANT
GOWN STRL REUS W/TWL LRG LVL3 (GOWN DISPOSABLE) ×6 IMPLANT
GOWN STRL REUS W/TWL XL LVL3 (GOWN DISPOSABLE) ×2
HEMOSTAT SNOW SURGICEL 2X4 (HEMOSTASIS) ×3 IMPLANT
INST SET LAPROSCOPIC AP (KITS) ×3 IMPLANT
IV NS IRRIG 3000ML ARTHROMATIC (IV SOLUTION) IMPLANT
KIT ROOM TURNOVER APOR (KITS) ×3 IMPLANT
MANIFOLD NEPTUNE II (INSTRUMENTS) ×3 IMPLANT
NEEDLE INSUFFLATION 14GA 120MM (NEEDLE) ×3 IMPLANT
NS IRRIG 1000ML POUR BTL (IV SOLUTION) ×3 IMPLANT
PACK LAP CHOLE LZT030E (CUSTOM PROCEDURE TRAY) ×3 IMPLANT
PAD ARMBOARD 7.5X6 YLW CONV (MISCELLANEOUS) ×3 IMPLANT
POUCH SPECIMEN RETRIEVAL 10MM (ENDOMECHANICALS) ×3 IMPLANT
SET BASIN LINEN APH (SET/KITS/TRAYS/PACK) ×3 IMPLANT
SET TUBE IRRIG SUCTION NO TIP (IRRIGATION / IRRIGATOR) IMPLANT
SLEEVE ENDOPATH XCEL 5M (ENDOMECHANICALS) ×3 IMPLANT
SPONGE GAUZE 2X2 8PLY STER LF (GAUZE/BANDAGES/DRESSINGS) ×4
SPONGE GAUZE 2X2 8PLY STRL LF (GAUZE/BANDAGES/DRESSINGS) ×8 IMPLANT
STAPLER VISISTAT (STAPLE) ×3 IMPLANT
SUT VICRYL 0 UR6 27IN ABS (SUTURE) ×3 IMPLANT
TAPE CLOTH SURG 4X10 WHT LF (GAUZE/BANDAGES/DRESSINGS) ×3 IMPLANT
TROCAR ENDO BLADELESS 11MM (ENDOMECHANICALS) ×3 IMPLANT
TROCAR XCEL NON-BLD 5MMX100MML (ENDOMECHANICALS) ×3 IMPLANT
TROCAR XCEL UNIV SLVE 11M 100M (ENDOMECHANICALS) ×3 IMPLANT
TUBING INSUFFLATION (TUBING) ×3 IMPLANT
WARMER LAPAROSCOPE (MISCELLANEOUS) ×3 IMPLANT
YANKAUER SUCT 12FT TUBE ARGYLE (SUCTIONS) ×3 IMPLANT

## 2014-01-27 NOTE — Transfer of Care (Signed)
Immediate Anesthesia Transfer of Care Note  Patient: Danielle Spears  Procedure(s) Performed: Procedure(s): LAPAROSCOPIC CHOLECYSTECTOMY (N/A)  Patient Location: PACU  Anesthesia Type:General  Level of Consciousness: sedated and patient cooperative  Airway & Oxygen Therapy: Patient Spontanous Breathing and Patient connected to face mask oxygen  Post-op Assessment: Report given to PACU RN and Post -op Vital signs reviewed and stable  Post vital signs: Reviewed and stable  Complications: No apparent anesthesia complications

## 2014-01-27 NOTE — Interval H&P Note (Signed)
History and Physical Interval Note:  01/27/2014 9:55 AM  Danielle Spears  has presented today for surgery, with the diagnosis of chronic cholecystitis  The various methods of treatment have been discussed with the patient and family. After consideration of risks, benefits and other options for treatment, the patient has consented to  Procedure(s): LAPAROSCOPIC CHOLECYSTECTOMY (N/A) as a surgical intervention .  The patient's history has been reviewed, patient examined, no change in status, stable for surgery.  I have reviewed the patient's chart and labs.  Questions were answered to the patient's satisfaction.     Franky MachoJENKINS,Ascher Schroepfer A

## 2014-01-27 NOTE — Anesthesia Procedure Notes (Signed)
Procedure Name: Intubation Date/Time: 01/27/2014 10:35 AM Performed by: Pernell DupreADAMS, AMY A Pre-anesthesia Checklist: Patient identified, Patient being monitored, Timeout performed, Emergency Drugs available and Suction available Patient Re-evaluated:Patient Re-evaluated prior to inductionOxygen Delivery Method: Circle System Utilized Preoxygenation: Pre-oxygenation with 100% oxygen Intubation Type: IV induction, Rapid sequence and Cricoid Pressure applied Ventilation: Mask ventilation without difficulty Laryngoscope Size: 3 and Miller Grade View: Grade I Tube type: Oral Tube size: 7.0 mm Number of attempts: 1 Airway Equipment and Method: stylet Placement Confirmation: ETT inserted through vocal cords under direct vision,  positive ETCO2 and breath sounds checked- equal and bilateral Secured at: 21 cm Tube secured with: Tape Dental Injury: Teeth and Oropharynx as per pre-operative assessment

## 2014-01-27 NOTE — Op Note (Signed)
Patient:  Danielle PouchRobin L Ratay  DOB:  1956-01-10  MRN:  [id number]   Preop Diagnosis:  Chronic cholecystitis  Postop Diagnosis:  Same  Procedure:  Laparoscopic cholecystectomy  Surgeon:  Franky MachoMark Deaisha Welborn, M.D.  Anes:  Gen. endotracheal  Indications:  Patient is a 59 year old white female who presents with biliary colic secondary to chronic cholecystitis. The risks and benefits of the procedure including bleeding, infection, hepatobiliary injury, and the possibility of an open procedure were fully explained to the patient, who gave informed consent.  Procedure note:  The patient was placed the supine position. After induction of general endotracheal anesthesia, the abdomen was prepped and draped using usual sterile technique with ChloraPrep. Surgical site confirmation was performed.  A supraumbilical incision was made down the fascia. A Veress needle was introduced into the abdominal cavity and confirmation of placement was done using the saline drop test. The abdomen was then insufflated to 16 mmHg pressure. An 11 mm trocar was introduced into the abdominal cavity under direct visualization without difficulty. The patient was placed in reverse Trendelenburg position and additional 11 mm trocar was placed the epigastric region 5 mm trochars were placed the right upper quadrant right flank regions. Liver was inspected and noted to be within normal limits. The gallbladder was retracted in a dynamic fashion in order to expose the triangle of Calot. The cystic duct was first identified. Its junction to the infundibulum was fully identified. Endoclips were placed proximally distally on the cystic duct, and the cystic duct was divided. This was likewise done cystic artery. The gallbladder was freed away from the gallbladder fossa using Bovie electrocautery. The gallbladder was delivered through the epigastric trocar site using an Endo Catch bag. The gallbladder fossa was inspected and no abnormal bleeding or bile  leakage was noted. Surgicel is placed the gallbladder fossa. All fluid and air were then evacuated from the abdominal cavity prior to removal of the trochars.  All wounds were irrigated with normal saline. All wounds were injected with 0.5% Sensorcaine. The supraumbilical fascia was reapproximated using 0 Vicryl interrupted suture. All skin incisions were closed using staples. Betadine ointment and dry sterile dressings were applied.  All tape and needle counts were correct at the end of the procedure. Patient was extubated in the operating room and transferred to PACU in stable condition.  Complications:  None  EBL:  Minimal  Specimen:  Gallbladder

## 2014-01-27 NOTE — Discharge Instructions (Signed)

## 2014-01-27 NOTE — Anesthesia Postprocedure Evaluation (Addendum)
  Anesthesia Post-op Note Late entry Patient: Danielle Spears  Procedure(s) Performed: Procedure(s): LAPAROSCOPIC CHOLECYSTECTOMY (N/A)  Patient Location: PACU  Anesthesia Type:General  Level of Consciousness: awake, alert , oriented and patient cooperative  Airway and Oxygen Therapy: Patient Spontanous Breathing  Post-op Pain: mild  Post-op Assessment: Post-op Vital signs reviewed, Patient's Cardiovascular Status Stable, Respiratory Function Stable, Patent Airway, No signs of Nausea or vomiting and Pain level controlled  Post-op Vital Signs: Reviewed and stable  Last Vitals:  Filed Vitals:   01/27/14 1230  BP: 121/60  Pulse: 51  Temp:   Resp: 14    Complications: No apparent anesthesia complications

## 2014-01-27 NOTE — Anesthesia Preprocedure Evaluation (Signed)
Anesthesia Evaluation  Patient identified by MRN, date of birth, ID band Patient awake    Reviewed: Allergy & Precautions, H&P , NPO status , Patient's Chart, lab work & pertinent test results  History of Anesthesia Complications (+) history of anesthetic complications (bradycardia during knee scope)  Airway Mallampati: II  TM Distance: >3 FB Neck ROM: full    Dental  (+) Teeth Intact   Pulmonary asthma , former smoker,  breath sounds clear to auscultation        Cardiovascular negative cardio ROS  Rhythm:Regular Rate:Normal     Neuro/Psych    GI/Hepatic   Endo/Other    Renal/GU      Musculoskeletal   Abdominal   Peds  Hematology   Anesthesia Other Findings   Reproductive/Obstetrics                             Anesthesia Physical Anesthesia Plan  ASA: II  Anesthesia Plan: General   Post-op Pain Management:    Induction: Intravenous, Rapid sequence and Cricoid pressure planned  Airway Management Planned: Oral ETT  Additional Equipment:   Intra-op Plan:   Post-operative Plan: Extubation in OR  Informed Consent: I have reviewed the patients History and Physical, chart, labs and discussed the procedure including the risks, benefits and alternatives for the proposed anesthesia with the patient or authorized representative who has indicated his/her understanding and acceptance.     Plan Discussed with:   Anesthesia Plan Comments:         Anesthesia Quick Evaluation

## 2014-01-28 ENCOUNTER — Encounter (HOSPITAL_COMMUNITY): Payer: Self-pay | Admitting: General Surgery

## 2014-01-28 NOTE — Anesthesia Postprocedure Evaluation (Signed)
  Anesthesia Post-op Note  Patient: Danielle Spears  Procedure(s) Performed: Procedure(s): LAPAROSCOPIC CHOLECYSTECTOMY (N/A)  Patient Location: 3A  Anesthesia Type:General  Level of Consciousness: awake, alert , oriented and patient cooperative  Airway and Oxygen Therapy: Patient Spontanous Breathing  Post-op Pain: none  Post-op Assessment: Post-op Vital signs reviewed, Patient's Cardiovascular Status Stable, Respiratory Function Stable, Patent Airway, No signs of Nausea or vomiting and Pain level controlled  Post-op Vital Signs: Reviewed and stable  Last Vitals:  Filed Vitals:   01/27/14 1248  BP: 137/82  Pulse: 57  Temp:   Resp: 16    Complications: No apparent anesthesia complications

## 2014-01-28 NOTE — Addendum Note (Signed)
Addendum  created 01/28/14 [id number] by Despina Hiddenobert J Toua Stites, CRNA   Modules edited: Notes Section   Notes Section:  File: [id number]

## 2014-02-23 ENCOUNTER — Encounter: Payer: Self-pay | Admitting: Internal Medicine

## 2014-03-01 ENCOUNTER — Other Ambulatory Visit: Payer: Self-pay | Admitting: Family Medicine

## 2014-06-07 ENCOUNTER — Other Ambulatory Visit: Payer: Self-pay | Admitting: Family Medicine

## 2014-08-20 ENCOUNTER — Other Ambulatory Visit: Payer: Self-pay | Admitting: Family Medicine

## 2014-08-23 NOTE — Telephone Encounter (Signed)
May have this +2 refills 

## 2014-08-27 ENCOUNTER — Ambulatory Visit (INDEPENDENT_AMBULATORY_CARE_PROVIDER_SITE_OTHER): Payer: Managed Care, Other (non HMO) | Admitting: Family Medicine

## 2014-08-27 ENCOUNTER — Encounter: Payer: Self-pay | Admitting: Family Medicine

## 2014-08-27 VITALS — BP 118/80 | Ht 62.5 in | Wt 180.5 lb

## 2014-08-27 DIAGNOSIS — Z01810 Encounter for preprocedural cardiovascular examination: Secondary | ICD-10-CM | POA: Diagnosis not present

## 2014-08-27 DIAGNOSIS — M1711 Unilateral primary osteoarthritis, right knee: Secondary | ICD-10-CM | POA: Diagnosis not present

## 2014-08-27 DIAGNOSIS — J4520 Mild intermittent asthma, uncomplicated: Secondary | ICD-10-CM

## 2014-08-27 DIAGNOSIS — R1013 Epigastric pain: Secondary | ICD-10-CM

## 2014-08-27 NOTE — Progress Notes (Signed)
   Subjective:    Patient ID: Danielle Spears, female    DOB: [birth year], 57 y.o.   MRN: [id number]  HPI  Patient in today to get medical clearance for right knee replacement surgery. Plans to go to rehab center- This patient has a history of asthma been under good control recently.she uses Advair daily but does not use albuterol has not had any need to use albuterol recently. No recent pulmonary infections no recent skin infections Patient denies any chest pressure tightness pain or shortness of breath with activity Proximally 1 month ago she was able to do elliptical training without difficulty Patient does have history of dyspepsia has been under good control with medication Patient denies any bleeding issues. No history of cardiac disease Review of Systems Denies chest tightness pressure pain shortness breath rectal bleeding. Denies headaches.    Objective:   Physical Exam Severe osteoarthritis in the right knee lungs are clear no crackles respiratory rate is normal heart regular no murmurs pulses normal abdomen is soft no guarding or rebound extremities no edema no masses throat normal       Assessment & Plan:  1. Pre-operative cardiovascular examination Her EKG overall looks normal await lab work - CBC with Differential/Platelet - Basic metabolic panel - EKG 12-Lead  2. Primary osteoarthritis of right knee She plans on having total knee replacement on August 22 Dr. Thurston Hole - CBC with Differential/Platelet - Basic metabolic panel - EKG 12-Lead  3. Asthma, mild intermittent, uncomplicated Asthma has been under good control continue current measures  4. Dyspepsia Patient takes PPI does good for her.  Patient is approved for surgery. Await lab work. May need further interventions depending on that. Patient was counseled to discuss with orthopedist what measures they will be doing to prevent DVT  No further cardiac workup is necessary.

## 2014-08-28 ENCOUNTER — Encounter: Payer: Self-pay | Admitting: Family Medicine

## 2014-08-28 LAB — BASIC METABOLIC PANEL
BUN/Creatinine Ratio: 21 (ref 9–23)
BUN: 15 mg/dL (ref 6–24)
CALCIUM: 9.5 mg/dL (ref 8.7–10.2)
CO2: 26 mmol/L (ref 18–29)
CREATININE: 0.7 mg/dL (ref 0.57–1.00)
Chloride: 102 mmol/L (ref 97–108)
GFR calc Af Amer: 112 mL/min/{1.73_m2} (ref >59)
GFR, EST NON AFRICAN AMERICAN: 97 mL/min/{1.73_m2} (ref >59)
Glucose: 92 mg/dL (ref 65–99)
POTASSIUM: 4.3 mmol/L (ref 3.5–5.2)
SODIUM: 142 mmol/L (ref 134–144)

## 2014-08-28 LAB — CBC WITH DIFFERENTIAL/PLATELET
Basophils Absolute: 0.1 10*3/uL (ref 0.0–0.2)
Basos: 1 %
EOS (ABSOLUTE): 0.2 10*3/uL (ref 0.0–0.4)
EOS: 5 %
HEMATOCRIT: 42 % (ref 34.0–46.6)
Hemoglobin: 13.7 g/dL (ref 11.1–15.9)
IMMATURE GRANS (ABS): 0 10*3/uL (ref 0.0–0.1)
IMMATURE GRANULOCYTES: 0 %
LYMPHS: 29 %
Lymphocytes Absolute: 1.4 10*3/uL (ref 0.7–3.1)
MCH: 30.2 pg (ref 26.6–33.0)
MCHC: 32.6 g/dL (ref 31.5–35.7)
MCV: 93 fL (ref 79–97)
Monocytes Absolute: 0.4 10*3/uL (ref 0.1–0.9)
Monocytes: 8 %
NEUTROS ABS: 2.6 10*3/uL (ref 1.4–7.0)
NEUTROS PCT: 57 %
Platelets: 211 10*3/uL (ref 150–379)
RBC: 4.53 x10E6/uL (ref 3.77–5.28)
RDW: 14.2 % (ref 12.3–15.4)
WBC: 4.6 10*3/uL (ref 3.4–10.8)

## 2014-08-31 ENCOUNTER — Telehealth: Payer: Self-pay

## 2014-08-31 NOTE — Telephone Encounter (Signed)
Notified patient per Dr. Lorin Picket- Lab work looks good. We will print a copy of this along with her office visit note that she can pick up to take with her for her future appointments. We will also send a copy to the specialists. Patient verbalized understanding.

## 2014-09-01 ENCOUNTER — Encounter (HOSPITAL_COMMUNITY): Payer: Self-pay | Admitting: Physician Assistant

## 2014-09-01 ENCOUNTER — Telehealth: Payer: Self-pay | Admitting: Family Medicine

## 2014-09-01 DIAGNOSIS — M1711 Unilateral primary osteoarthritis, right knee: Secondary | ICD-10-CM | POA: Diagnosis present

## 2014-09-01 DIAGNOSIS — T8859XA Other complications of anesthesia, initial encounter: Secondary | ICD-10-CM | POA: Diagnosis present

## 2014-09-01 DIAGNOSIS — T4145XA Adverse effect of unspecified anesthetic, initial encounter: Secondary | ICD-10-CM | POA: Diagnosis present

## 2014-09-01 NOTE — H&P (Signed)
TOTAL KNEE ADMISSION H&P  Patient is being admitted for right total knee arthroplasty.  Subjective:  Chief Complaint:right knee pain.  HPI: Danielle Spears, 59 y.o. female, has a history of pain and functional disability in the right knee due to arthritis and has failed non-surgical conservative treatments for greater than 12 weeks to includeNSAID's and/or analgesics, corticosteriod injections, viscosupplementation injections, flexibility and strengthening excercises, supervised PT with diminished ADL's post treatment, weight reduction as appropriate and activity modification.  Onset of symptoms was gradual, starting 10 years ago with gradually worsening course since that time. The patient noted prior procedures on the knee to include  arthroscopy and menisectomy on the right knee(s).  Patient currently rates pain in the right knee(s) at 10 out of 10 with activity. Patient has night pain, worsening of pain with activity and weight bearing, pain that interferes with activities of daily living, crepitus and joint swelling.  Patient has evidence of subchondral sclerosis, periarticular osteophytes and joint space narrowing by imaging studies. There is no active infection.  Patient Active Problem List   Diagnosis Date Noted  . Primary localized osteoarthritis of right knee   . Complication of anesthesia   . Dyspepsia 12/08/2013  . 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 Oral thrush 10/20/2013  . Dysphagia 10/20/2013  . Asthma 02/06/2013   Past Medical History  Diagnosis Date  . Asthma   . Seasonal allergies   . Complication of anesthesia     Severe Bradycardia with knee surgery  . Primary localized osteoarthritis of right knee     Past Surgical History  Procedure Laterality Date  . Cesarean section    . 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 Knee arthroscopy with medial menisectomy Right 05/02/2012    Procedure: KNEE ARTHROSCOPY WITH PARTIAL MEDIAL AND LATERAL MENISECTOMY  AND PATELLA-FEMORAL  CHONDROPLASTY;  Surgeon: Harvie Junior, MD;  Location: [facility];  Service: Orthopedics;  Laterality: Right;  . Esophagogastroduodenoscopy  2004    Dr. Karilyn Cota: empiric dilation, gastric and pyloric channel inflammation  . Esophagogastroduodenoscopy N/A 10/26/2013    Dr. Rourk:Distal esophageal erosions consistent with mild erosive refluxesophagitis. Status post passage of a Maloney dilator followed by esophageal biopsy. stomach biopsy with increase of intraepthelial eosinophils, esophagus without eosinophilic esophagitis  . Maloney dilation N/A 10/26/2013    Procedure: Elease Hashimoto DILATION;  Surgeon: Corbin Ade, MD;  Location: [facility];  Service: Endoscopy;  Laterality: N/A;  . Cholecystectomy N/A 01/27/2014    Procedure: LAPAROSCOPIC CHOLECYSTECTOMY;  Surgeon: Dalia Heading, MD;  Location: [facility];  Service: General;  Laterality: N/A;    No prescriptions prior to admission  No current facility-administered medications for this encounter.  Current outpatient prescriptions:  .  ADVAIR DISKUS 250-50 MCG/DOSE AEPB, USE 1 INHALATION ORALLY EVERY 12 HOURS, Disp: 60 each, Rfl: 2 .  albuterol (PROVENTIL HFA;VENTOLIN HFA) 108 (90 BASE) MCG/ACT inhaler, Inhale 2 puffs into the lungs every 4 (four) hours as needed for wheezing or shortness of breath., Disp: , Rfl:  .  ALPRAZolam (XANAX) 0.5 MG tablet, TAKE ONE-HALF TO ONE TABLET BY MOUTH THREE TIMES DAILY AS NEEDED FOR ANXIOUSNESS, Disp: 30 tablet, Rfl: 2 .  cetirizine (ZYRTEC) 10 MG tablet, Take 10 mg by mouth daily., Disp: , Rfl:  .  Cholecalciferol (VITAMIN D3) 1000 UNITS CAPS, Take 1,000 Units by mouth daily., Disp: , Rfl:  .  fish oil-omega-3 fatty acids 1000 MG capsule, Take 2 g by mouth daily., Disp: ,  Rfl:  .  Multiple Vitamins-Minerals (MULTIVITAMIN WITH MINERALS) tablet, Take 1 tablet by mouth daily., Disp: , Rfl:  .  oxyCODONE-acetaminophen (PERCOCET) 7.5-325 MG per tablet, Take 1-2 tablets by mouth every 4  (four) hours as needed., Disp: 50 tablet, Rfl: 0 .  VENTOLIN HFA 108 (90 BASE) MCG/ACT inhaler, INHALE TWO PUFFS BY MOUTH EVERY 4 HOURS AS NEEDED, Disp: 18 each, Rfl: 5 .  b complex vitamins tablet, Take 1 tablet by mouth daily., Disp: , Rfl:  .  fexofenadine (ALLEGRA) 180 MG tablet, Take 180 mg by mouth daily as needed for allergies. , Disp: , Rfl:  .  pantoprazole (PROTONIX) 40 MG tablet, Take 1 tablet (40 mg total) by mouth daily. (Patient not taking: Reported on 08/27/2014), Disp: 30 tablet, Rfl: 3 No Known Allergies  Social History  Substance Use Topics  . Smoking status: Former Smoker -- 0.25 packs/day for 5 years    Quit date: 05/01/1999  . Smokeless tobacco: Never Used  . Alcohol Use: No    Family History  Problem Relation Age of Onset  . Diabetes Father   . Heart attack Father   . Cancer Father     prostate  . Heart disease Father   . Colon cancer Neg Hx   . Parkinson's disease Mother   . Arthritis Mother      Review of Systems  Constitutional: Negative.   HENT: Negative.   Eyes: Negative.   Respiratory: Negative.   Cardiovascular: Negative.   Gastrointestinal: Negative.   Genitourinary: Negative.   Musculoskeletal: Positive for back pain and joint pain.  Skin: Negative.   Neurological: Negative.   Endo/Heme/Allergies: Negative.   Psychiatric/Behavioral: Negative.     Objective:  Physical Exam  Constitutional: She is oriented to person, place, and time. She appears well-developed and well-nourished.  HENT:  Head: Normocephalic and atraumatic.  Mouth/Throat: Oropharynx is clear and moist.  Eyes: Conjunctivae are normal. Pupils are equal, round, and reactive to light.  Neck: Normal range of motion. Neck supple.  Cardiovascular: Normal rate, regular rhythm and normal heart sounds.   Respiratory: Effort normal and breath sounds normal.  GI: Soft. Bowel sounds are normal.  Genitourinary:  Not pertinent to current symptomatology therefore not examined.   Musculoskeletal:  Examination of her right knee reveals 2+ effusion.  3+ crepitation.  Range of motion is from 0-120 degrees with patella subluxation noted and severe pain.  Examination of her left knee reveals full range of motion without pain, swelling, weakness or instability. Vascular exam: Pulses are 2+ and symmetric.    Neurological: She is alert and oriented to person, place, and time.  Skin: Skin is warm and dry.  Psychiatric: She has a normal mood and affect. Her behavior is normal.    Vital signs in last 24 hours: Temp:  [98.2 F (36.8 C)] 98.2 F (36.8 C) (08/10 1500) Pulse Rate:  [78] 78 (08/10 1500) BP: (135)/(92) 135/92 mmHg (08/10 1500) SpO2:  [95 %] 95 % (08/10 1500) Weight:  [82.555 kg (182 lb)] 82.555 kg (182 lb) (08/10 1500)  Labs:   Estimated body mass index is 34.41 kg/(m^2) as calculated from the following:   Height as of this encounter: 5\' 1"  (1.549 m).   Weight as of this encounter: 82.555 kg (182 lb).   Imaging Review Plain radiographs demonstrate severe degenerative joint disease of the right knee(s). The overall alignment issignificant varus. The bone quality appears to be good for age and reported activity level.  Assessment/Plan:  End stage  arthritis, right knee   The patient history, physical examination, clinical judgment of the provider and imaging studies are consistent with end stage degenerative joint disease of the right knee(s) and total knee arthroplasty is deemed medically necessary. The treatment options including medical management, injection therapy arthroscopy and arthroplasty were discussed at length. The risks and benefits of total knee arthroplasty were presented and reviewed. The risks due to aseptic loosening, infection, stiffness, patella tracking problems, thromboembolic complications and other imponderables were discussed. The patient acknowledged the explanation, agreed to proceed with the plan and consent was signed. Patient is  being admitted for inpatient treatment for surgery, pain control, PT, OT, prophylactic antibiotics, VTE prophylaxis, progressive ambulation and ADL's and discharge planning. The patient is planning to be discharged to skilled nursing facility wants to go to [facility] in [place]  Lennie Vasco A. Gwinda Passe Physician Assistant [facility] [phone]  09/01/2014, 3:27 PM

## 2014-09-01 NOTE — Telephone Encounter (Signed)
I received a second request from Dr. Eulah Pont regarding surgical clearance. This second request was before we sent information to them. Please call their office to verify that they did in fact received the surgical clearance if they have not they will need a copy of lab work, office visit, EKG and surgical clearance. Please make certain that a copy of the EKG which was done on the date of her evaluation is sent as well if it has not been sent please find it ends fax them a copy thank you(I believe a lot of this has already been done but we must call their office to verify they received thank you)

## 2014-09-01 NOTE — Pre-Procedure Instructions (Addendum)
    Danielle Spears  09/01/2014      [facility] - [place], [place] - [address] [place] [address] Phone: [phone] Fax: [phone]  [facility] [place] - [address] [place] [address] Phone: [phone] Fax: [phone]  [facility] - [place], [place] - [address] [place] [address] Phone: [phone] Fax: [phone]    Your procedure is scheduled on 09/13/2014.  Report to [facility] Admitting at 1045 A.M.  Call this number if you have problems in the days leading up to your surgery:  [phone]  Call this number if you have problems the morning of surgery:  [phone]   Remember:  Do not eat food or drink liquids after midnight on Sunday August 21st.  Take these medicines the morning of surgery with A SIP OF WATER Advair, Albuterol inhaler, Alprazolam (Xanax), Cetirizine (Zyrtec), Fexofendadine (Allegra), Oxycodone-Acetaminophen (Tylenol), Pantoprazole (Protonix), Ventolin inhaler  STOP: ALL Vitamins, Supplements, Effient and Herbal Medications, Fish Oils, Aspirins, NSAIDs (Nonsteroidal Anti-inflammatories such as Ibuprofen, Aleve, or Advil), and Goody's/BC Powders 7 days prior to surgery, until after surgery as directed by your physician.     Do not wear jewelry, make-up or nail polish.  Do not wear lotions, powders, or perfumes.  You may wear deodorant.  Do not shave 48 hours prior to surgery.    Do not bring valuables to the hospital.  [facility] is not responsible for any belongings or valuables.  Contacts, dentures or bridgework may not be worn into surgery.  Leave your suitcase in the car.  After surgery it may be brought to your room.  For patients admitted to the hospital, discharge time will be determined by your treatment team.  Patients discharged the day of surgery will not be allowed to drive home.   Special  instructions:  Please follow these instructions carefully:  1. Shower with CHG Soap the night before surgery and the morning of Surgery. 2. If you choose to wash your hair, wash your hair first as usual with your normal shampoo. 3. After you shampoo, rinse your hair and body thoroughly to remove the Shampoo. 4. Use CHG as you would any other liquid soap. You can apply chg directly to the skin and wash gently with scrungie or a clean washcloth. 5. Apply the CHG Soap to your body ONLY FROM THE NECK DOWN. Do not use on open wounds or open sores. Avoid contact with your eyes, ears, mouth and genitals (private parts). Wash genitals (private parts) with your normal soap. 6. Wash thoroughly, paying special attention to the area where your surgery will be performed. 7. Thoroughly rinse your body with warm water from the neck down. 8. DO NOT shower/wash with your normal soap after using and rinsing off the CHG Soap. 9. Pat yourself dry with a clean towel.  10. Wear clean pajamas.  11. Place clean sheets on your bed the night of your first shower and do not sleep with pets.  Day of Surgery  Do not apply any lotions/deodorants the morning of surgery. Please wear clean clothes to the hospital/surgery center.    Please read over the following fact sheets that you were given. Pain Booklet, Coughing and Deep Breathing, Blood Transfusion Information, Total Joint Packet, MRSA Information and Surgical Site Infection Prevention

## 2014-09-02 ENCOUNTER — Encounter (HOSPITAL_COMMUNITY): Payer: Self-pay

## 2014-09-02 ENCOUNTER — Encounter (HOSPITAL_COMMUNITY)
Admission: RE | Admit: 2014-09-02 | Discharge: 2014-09-02 | Disposition: A | Discharge status: Home / Self Care | Payer: Managed Care, Other (non HMO) | Source: Ambulatory Visit | Attending: Orthopedic Surgery | Admitting: Orthopedic Surgery

## 2014-09-02 DIAGNOSIS — Z01812 Encounter for preprocedural laboratory examination: Secondary | ICD-10-CM | POA: Diagnosis not present

## 2014-09-02 DIAGNOSIS — E669 Obesity, unspecified: Secondary | ICD-10-CM | POA: Diagnosis not present

## 2014-09-02 DIAGNOSIS — M1711 Unilateral primary osteoarthritis, right knee: Secondary | ICD-10-CM | POA: Insufficient documentation

## 2014-09-02 DIAGNOSIS — Z0183 Encounter for blood typing: Secondary | ICD-10-CM | POA: Diagnosis not present

## 2014-09-02 DIAGNOSIS — Z79899 Other long term (current) drug therapy: Secondary | ICD-10-CM | POA: Diagnosis not present

## 2014-09-02 DIAGNOSIS — Z87891 Personal history of nicotine dependence: Secondary | ICD-10-CM | POA: Diagnosis not present

## 2014-09-02 DIAGNOSIS — J45909 Unspecified asthma, uncomplicated: Secondary | ICD-10-CM | POA: Diagnosis not present

## 2014-09-02 DIAGNOSIS — Z01818 Encounter for other preprocedural examination: Secondary | ICD-10-CM | POA: Insufficient documentation

## 2014-09-02 LAB — PROTIME-INR
INR: 0.92 (ref 0.00–1.49)
PROTHROMBIN TIME: 12.5 s (ref 11.6–15.2)

## 2014-09-02 LAB — CBC WITH DIFFERENTIAL/PLATELET
BASOS ABS: 0.1 10*3/uL (ref 0.0–0.1)
Basophils Relative: 1 % (ref 0–1)
Eosinophils Absolute: 0.2 10*3/uL (ref 0.0–0.7)
Eosinophils Relative: 6 % — ABNORMAL HIGH (ref 0–5)
HCT: 42.3 % (ref 36.0–46.0)
Hemoglobin: 14.5 g/dL (ref 12.0–15.0)
Lymphocytes Relative: 33 % (ref 12–46)
Lymphs Abs: 1.3 10*3/uL (ref 0.7–4.0)
MCH: 31.5 pg (ref 26.0–34.0)
MCHC: 34.3 g/dL (ref 30.0–36.0)
MCV: 92 fL (ref 78.0–100.0)
MONO ABS: 0.4 10*3/uL (ref 0.1–1.0)
Monocytes Relative: 11 % (ref 3–12)
NEUTROS ABS: 2 10*3/uL (ref 1.7–7.7)
Neutrophils Relative %: 49 % (ref 43–77)
Platelets: 183 10*3/uL (ref 150–400)
RBC: 4.6 MIL/uL (ref 3.87–5.11)
RDW: 13.1 % (ref 11.5–15.5)
WBC: 3.9 10*3/uL — ABNORMAL LOW (ref 4.0–10.5)

## 2014-09-02 LAB — ABO/RH: ABO/RH(D): AB POS

## 2014-09-02 LAB — COMPREHENSIVE METABOLIC PANEL
ALK PHOS: 63 U/L (ref 38–126)
ALT: 33 U/L (ref 14–54)
AST: 26 U/L (ref 15–41)
Albumin: 4.1 g/dL (ref 3.5–5.0)
Anion gap: 6 (ref 5–15)
BUN: 12 mg/dL (ref 6–20)
CALCIUM: 9.4 mg/dL (ref 8.9–10.3)
CO2: 29 mmol/L (ref 22–32)
Chloride: 105 mmol/L (ref 101–111)
Creatinine, Ser: 0.7 mg/dL (ref 0.44–1.00)
GFR calc non Af Amer: >60 mL/min (ref >60)
Glucose, Bld: 95 mg/dL (ref 65–99)
Potassium: 4.2 mmol/L (ref 3.5–5.1)
SODIUM: 140 mmol/L (ref 135–145)
Total Bilirubin: 0.8 mg/dL (ref 0.3–1.2)
Total Protein: 7.1 g/dL (ref 6.5–8.1)

## 2014-09-02 LAB — SURGICAL PCR SCREEN
MRSA, PCR: NEGATIVE
Staphylococcus aureus: NEGATIVE

## 2014-09-02 LAB — TYPE AND SCREEN
ABO/RH(D): AB POS
Antibody Screen: NEGATIVE

## 2014-09-02 LAB — APTT: aPTT: 27 seconds (ref 24–37)

## 2014-09-02 NOTE — Progress Notes (Signed)
Patient reported hx of severe bradycardia during prior surgery.  Denies follow-up with a cardiologist, has medical clearance and most recent EKG was negative.  Have informed Shonna Chock, PA; stated she will review chart.

## 2014-09-02 NOTE — Telephone Encounter (Signed)
Called their office and they did receive records notes, clearance form, ekg and lab work.

## 2014-09-03 LAB — URINE CULTURE: CULTURE: NO GROWTH

## 2014-09-03 NOTE — Progress Notes (Signed)
Anesthesia Chart Review: Patient is a 59 year old female scheduled for right TKA on 09/13/14 by Dr. Thurston Hole.  History includes former smoker, asthma, right knee arthroscopy 05/02/12, cholecystectomy 01/27/14, obesity. She reported bradycardia as a complication of anesthesia during her 05/02/12 surgery. Records reviewed in Epic and showed that her HR dropped to ~ 40 bpm, and she was given atropine 0.1 mg X 2 with a response back up into the 50's.     PCP Dr. Gerda Diss approved patient for this surgery. No cardiac work-up recommended. EKG at his office on 08/27/14 showed SB at 56 bpm.  Meds include Advair, albuterol, Xanax, fish oil, Percocet, Protonix.  Preoperative labs noted. Urine culture is still pending.   Anticipate that she can proceed as planned.  Velna Ochs [facility]/Anesthesiology Phone [phone] 09/03/2014 10:07 AM

## 2014-09-10 ENCOUNTER — Other Ambulatory Visit: Payer: Self-pay | Admitting: Family Medicine

## 2014-09-10 MED ORDER — CHLORHEXIDINE GLUCONATE 4 % EX LIQD: 60.0000 mL | CUTANEOUS | Status: DC

## 2014-09-10 MED ORDER — POVIDONE-IODINE 7.5 % EX SOLN
CUTANEOUS | Status: DC
  Filled 2014-09-10: qty 118

## 2014-09-10 MED ORDER — CEFAZOLIN SODIUM-DEXTROSE 2-3 GM-% IV SOLR
2.0000 g | INTRAVENOUS | Status: AC
  Administered 2014-09-13: 2 g via INTRAVENOUS
  Filled 2014-09-10: qty 50

## 2014-09-10 MED ORDER — LACTATED RINGERS IV SOLN
INTRAVENOUS | Status: DC
  Administered 2014-09-13: 10 mL/h via INTRAVENOUS

## 2014-09-10 NOTE — Progress Notes (Signed)
Pt. Notified of time change for surgery. Instructed to arrive @ 0845 ,pt. Voices understanding.

## 2014-09-13 ENCOUNTER — Encounter (HOSPITAL_COMMUNITY): Payer: Self-pay | Admitting: *Deleted

## 2014-09-13 ENCOUNTER — Encounter (HOSPITAL_COMMUNITY)
Admission: RE | Disposition: A | Discharge status: Home / Self Care | Payer: Self-pay | Source: Ambulatory Visit | Attending: Orthopedic Surgery

## 2014-09-13 ENCOUNTER — Inpatient Hospital Stay (HOSPITAL_COMMUNITY): Payer: Managed Care, Other (non HMO) | Admitting: Emergency Medicine

## 2014-09-13 ENCOUNTER — Inpatient Hospital Stay (HOSPITAL_COMMUNITY)
Admission: RE | Admit: 2014-09-13 | Discharge: 2014-09-15 | DRG: 470 | Disposition: A | Discharge status: Home / Self Care | Payer: Managed Care, Other (non HMO) | Source: Ambulatory Visit | Attending: Orthopedic Surgery | Admitting: Orthopedic Surgery

## 2014-09-13 ENCOUNTER — Inpatient Hospital Stay (HOSPITAL_COMMUNITY): Payer: Managed Care, Other (non HMO) | Admitting: Anesthesiology

## 2014-09-13 DIAGNOSIS — Z87891 Personal history of nicotine dependence: Secondary | ICD-10-CM

## 2014-09-13 DIAGNOSIS — M1711 Unilateral primary osteoarthritis, right knee: Principal | ICD-10-CM | POA: Diagnosis present

## 2014-09-13 DIAGNOSIS — K9189 Other postprocedural complications and disorders of digestive system: Secondary | ICD-10-CM | POA: Diagnosis not present

## 2014-09-13 DIAGNOSIS — T8859XA Other complications of anesthesia, initial encounter: Secondary | ICD-10-CM | POA: Diagnosis present

## 2014-09-13 DIAGNOSIS — T4145XA Adverse effect of unspecified anesthetic, initial encounter: Secondary | ICD-10-CM | POA: Diagnosis present

## 2014-09-13 DIAGNOSIS — J45909 Unspecified asthma, uncomplicated: Secondary | ICD-10-CM | POA: Diagnosis present

## 2014-09-13 DIAGNOSIS — Z79899 Other long term (current) drug therapy: Secondary | ICD-10-CM

## 2014-09-13 DIAGNOSIS — M1710 Unilateral primary osteoarthritis, unspecified knee: Secondary | ICD-10-CM | POA: Diagnosis present

## 2014-09-13 DIAGNOSIS — M179 Osteoarthritis of knee, unspecified: Secondary | ICD-10-CM | POA: Diagnosis present

## 2014-09-13 HISTORY — DX: Unilateral primary osteoarthritis, right knee: M17.11

## 2014-09-13 HISTORY — PX: TOTAL KNEE ARTHROPLASTY: SHX125

## 2014-09-13 SURGERY — ARTHROPLASTY, KNEE, TOTAL
Anesthesia: Regional | Site: Knee | Laterality: Right

## 2014-09-13 MED ORDER — PROPOFOL INFUSION 10 MG/ML OPTIME
INTRAVENOUS | Status: DC | PRN
  Administered 2014-09-13: 50 ug/kg/min via INTRAVENOUS

## 2014-09-13 MED ORDER — CHLORHEXIDINE GLUCONATE 4 % EX LIQD: 60.0000 mL | Freq: Once | CUTANEOUS | Status: DC

## 2014-09-13 MED ORDER — PROMETHAZINE HCL 25 MG/ML IJ SOLN: 6.2500 mg | INTRAMUSCULAR | Status: DC | PRN

## 2014-09-13 MED ORDER — ALUM & MAG HYDROXIDE-SIMETH 200-200-20 MG/5ML PO SUSP: 30.0000 mL | ORAL | Status: DC | PRN

## 2014-09-13 MED ORDER — PROPOFOL 10 MG/ML IV BOLUS
INTRAVENOUS | Status: AC
  Filled 2014-09-13: qty 20

## 2014-09-13 MED ORDER — ALBUTEROL SULFATE (2.5 MG/3ML) 0.083% IN NEBU: 3.0000 mL | INHALATION_SOLUTION | RESPIRATORY_TRACT | Status: DC | PRN

## 2014-09-13 MED ORDER — VITAMIN D3 25 MCG (1000 UT) PO CAPS: 1000.0000 [IU] | ORAL_CAPSULE | Freq: Every day | ORAL | Status: DC

## 2014-09-13 MED ORDER — ACETAMINOPHEN 325 MG PO TABS: 650.0000 mg | ORAL_TABLET | Freq: Four times a day (QID) | ORAL | Status: DC | PRN

## 2014-09-13 MED ORDER — BUPIVACAINE-EPINEPHRINE 0.25% -1:200000 IJ SOLN
INTRAMUSCULAR | Status: DC | PRN
  Administered 2014-09-13: 30 mL

## 2014-09-13 MED ORDER — SODIUM CHLORIDE 0.9 % IJ SOLN
INTRAMUSCULAR | Status: AC
Start: 2014-09-13 — End: 2014-09-13
  Filled 2014-09-13: qty 10

## 2014-09-13 MED ORDER — POTASSIUM CHLORIDE IN NACL 20-0.9 MEQ/L-% IV SOLN
INTRAVENOUS | Status: DC
  Administered 2014-09-14: 04:00:00 via INTRAVENOUS
  Filled 2014-09-13: qty 1000

## 2014-09-13 MED ORDER — FENTANYL CITRATE (PF) 250 MCG/5ML IJ SOLN
INTRAMUSCULAR | Status: AC
Start: 2014-09-13 — End: 2014-09-13
  Filled 2014-09-13: qty 5

## 2014-09-13 MED ORDER — PANTOPRAZOLE SODIUM 40 MG PO TBEC: 40.0000 mg | DELAYED_RELEASE_TABLET | Freq: Every day | ORAL | Status: DC

## 2014-09-13 MED ORDER — OXYCODONE HCL 5 MG PO TABS: 5.0000 mg | ORAL_TABLET | Freq: Once | ORAL | Status: DC | PRN

## 2014-09-13 MED ORDER — MOMETASONE FURO-FORMOTEROL FUM 100-5 MCG/ACT IN AERO
2.0000 | INHALATION_SPRAY | Freq: Two times a day (BID) | RESPIRATORY_TRACT | Status: DC
  Filled 2014-09-13: qty 8.8

## 2014-09-13 MED ORDER — ALPRAZOLAM 0.5 MG PO TABS
0.5000 mg | ORAL_TABLET | Freq: Three times a day (TID) | ORAL | Status: DC | PRN
  Administered 2014-09-15: 0.5 mg via ORAL
  Filled 2014-09-13: qty 1

## 2014-09-13 MED ORDER — ONDANSETRON HCL 4 MG/2ML IJ SOLN
4.0000 mg | Freq: Four times a day (QID) | INTRAMUSCULAR | Status: DC | PRN
  Administered 2014-09-13 – 2014-09-14 (×3): 4 mg via INTRAVENOUS
  Filled 2014-09-13 (×3): qty 2

## 2014-09-13 MED ORDER — PROPOFOL INFUSION 10 MG/ML OPTIME
INTRAVENOUS | Status: DC | PRN
  Administered 2014-09-13: 130 mL via INTRAVENOUS
  Administered 2014-09-13: 70 mL via INTRAVENOUS

## 2014-09-13 MED ORDER — POLYETHYLENE GLYCOL 3350 17 G PO PACK
17.0000 g | PACK | Freq: Two times a day (BID) | ORAL | Status: DC
  Administered 2014-09-13 – 2014-09-15 (×4): 17 g via ORAL
  Filled 2014-09-13 (×4): qty 1

## 2014-09-13 MED ORDER — EPHEDRINE SULFATE 50 MG/ML IJ SOLN
INTRAMUSCULAR | Status: AC
  Filled 2014-09-13: qty 1

## 2014-09-13 MED ORDER — HYDROMORPHONE HCL 1 MG/ML IJ SOLN
1.0000 mg | INTRAMUSCULAR | Status: DC | PRN
  Administered 2014-09-13: 1 mg via INTRAVENOUS
  Filled 2014-09-13: qty 1

## 2014-09-13 MED ORDER — DEXAMETHASONE SODIUM PHOSPHATE 10 MG/ML IJ SOLN
10.0000 mg | Freq: Three times a day (TID) | INTRAMUSCULAR | Status: DC
  Administered 2014-09-13 – 2014-09-15 (×5): 10 mg via INTRAVENOUS
  Filled 2014-09-13 (×5): qty 1

## 2014-09-13 MED ORDER — VITAMIN D 1000 UNITS PO TABS
1000.0000 [IU] | ORAL_TABLET | Freq: Every day | ORAL | Status: DC
  Administered 2014-09-14 – 2014-09-15 (×2): 1000 [IU] via ORAL
  Filled 2014-09-13 (×2): qty 1

## 2014-09-13 MED ORDER — PHENYLEPHRINE 40 MCG/ML (10ML) SYRINGE FOR IV PUSH (FOR BLOOD PRESSURE SUPPORT)
PREFILLED_SYRINGE | INTRAVENOUS | Status: AC
Start: 2014-09-13 — End: 2014-09-13
  Filled 2014-09-13: qty 10

## 2014-09-13 MED ORDER — LIDOCAINE HCL (CARDIAC) 20 MG/ML IV SOLN
INTRAVENOUS | Status: AC
  Filled 2014-09-13: qty 5

## 2014-09-13 MED ORDER — CEFAZOLIN SODIUM-DEXTROSE 2-3 GM-% IV SOLR
2.0000 g | Freq: Four times a day (QID) | INTRAVENOUS | Status: AC
  Administered 2014-09-13 (×2): 2 g via INTRAVENOUS
  Filled 2014-09-13 (×2): qty 50

## 2014-09-13 MED ORDER — METOCLOPRAMIDE HCL 5 MG PO TABS: 5.0000 mg | ORAL_TABLET | Freq: Three times a day (TID) | ORAL | Status: DC | PRN

## 2014-09-13 MED ORDER — ACETAMINOPHEN 650 MG RE SUPP: 650.0000 mg | Freq: Four times a day (QID) | RECTAL | Status: DC | PRN

## 2014-09-13 MED ORDER — FENTANYL CITRATE (PF) 250 MCG/5ML IJ SOLN
INTRAMUSCULAR | Status: DC | PRN
  Administered 2014-09-13 (×6): 50 ug via INTRAVENOUS

## 2014-09-13 MED ORDER — MIDAZOLAM HCL 2 MG/2ML IJ SOLN
INTRAMUSCULAR | Status: AC
  Administered 2014-09-13: 2 mg
  Filled 2014-09-13: qty 2

## 2014-09-13 MED ORDER — LACTATED RINGERS IV SOLN
INTRAVENOUS | Status: DC
  Administered 2014-09-13 (×2): via INTRAVENOUS

## 2014-09-13 MED ORDER — KETOROLAC TROMETHAMINE 30 MG/ML IJ SOLN
30.0000 mg | Freq: Once | INTRAMUSCULAR | Status: AC
  Administered 2014-09-13: 30 mg via INTRAVENOUS

## 2014-09-13 MED ORDER — ONDANSETRON HCL 4 MG PO TABS
4.0000 mg | ORAL_TABLET | Freq: Four times a day (QID) | ORAL | Status: DC | PRN
  Administered 2014-09-14: 4 mg via ORAL
  Filled 2014-09-13: qty 1

## 2014-09-13 MED ORDER — OXYCODONE HCL 5 MG/5ML PO SOLN: 5.0000 mg | Freq: Once | ORAL | Status: DC | PRN

## 2014-09-13 MED ORDER — MENTHOL 3 MG MT LOZG: 1.0000 | LOZENGE | OROMUCOSAL | Status: DC | PRN

## 2014-09-13 MED ORDER — HYDROMORPHONE HCL 1 MG/ML IJ SOLN
0.2500 mg | INTRAMUSCULAR | Status: DC | PRN
  Administered 2014-09-13 (×2): 0.5 mg via INTRAVENOUS

## 2014-09-13 MED ORDER — OXYCODONE HCL 5 MG PO TABS
5.0000 mg | ORAL_TABLET | ORAL | Status: DC | PRN
  Administered 2014-09-13 – 2014-09-15 (×7): 10 mg via ORAL
  Filled 2014-09-13 (×7): qty 2

## 2014-09-13 MED ORDER — FENTANYL CITRATE (PF) 250 MCG/5ML IJ SOLN
INTRAMUSCULAR | Status: AC
  Filled 2014-09-13: qty 5

## 2014-09-13 MED ORDER — HYDROMORPHONE HCL 1 MG/ML IJ SOLN
INTRAMUSCULAR | Status: AC
  Filled 2014-09-13: qty 1

## 2014-09-13 MED ORDER — SODIUM CHLORIDE 0.9 % IR SOLN
Status: DC | PRN
  Administered 2014-09-13: 3000 mL

## 2014-09-13 MED ORDER — METOCLOPRAMIDE HCL 5 MG/ML IJ SOLN
5.0000 mg | Freq: Three times a day (TID) | INTRAMUSCULAR | Status: DC | PRN
  Administered 2014-09-13: 5 mg via INTRAVENOUS
  Filled 2014-09-13: qty 2

## 2014-09-13 MED ORDER — DIPHENHYDRAMINE HCL 12.5 MG/5ML PO ELIX
12.5000 mg | ORAL_SOLUTION | ORAL | Status: DC | PRN
Start: 2014-09-13 — End: 2014-09-15

## 2014-09-13 MED ORDER — SODIUM CHLORIDE 0.9 % IR SOLN
Status: DC | PRN
  Administered 2014-09-13: 1000 mL

## 2014-09-13 MED ORDER — BUPIVACAINE-EPINEPHRINE (PF) 0.25% -1:200000 IJ SOLN
INTRAMUSCULAR | Status: AC
  Filled 2014-09-13: qty 30

## 2014-09-13 MED ORDER — KETOROLAC TROMETHAMINE 30 MG/ML IJ SOLN
INTRAMUSCULAR | Status: AC
  Filled 2014-09-13: qty 1

## 2014-09-13 MED ORDER — APIXABAN 2.5 MG PO TABS
2.5000 mg | ORAL_TABLET | Freq: Two times a day (BID) | ORAL | Status: DC
  Administered 2014-09-14 – 2014-09-15 (×3): 2.5 mg via ORAL
  Filled 2014-09-13 (×3): qty 1

## 2014-09-13 MED ORDER — OXYCODONE HCL ER 10 MG PO T12A
20.0000 mg | EXTENDED_RELEASE_TABLET | Freq: Two times a day (BID) | ORAL | Status: DC
  Administered 2014-09-13 – 2014-09-15 (×4): 20 mg via ORAL
  Filled 2014-09-13 (×4): qty 2

## 2014-09-13 MED ORDER — GLYCOPYRROLATE 0.2 MG/ML IJ SOLN
INTRAMUSCULAR | Status: DC | PRN
  Administered 2014-09-13: 0.2 mg via INTRAVENOUS

## 2014-09-13 MED ORDER — GLYCOPYRROLATE 0.2 MG/ML IJ SOLN
INTRAMUSCULAR | Status: AC
  Filled 2014-09-13: qty 1

## 2014-09-13 MED ORDER — BUPIVACAINE-EPINEPHRINE (PF) 0.5% -1:200000 IJ SOLN
INTRAMUSCULAR | Status: DC | PRN
  Administered 2014-09-13: 25 mL via PERINEURAL

## 2014-09-13 MED ORDER — DOCUSATE SODIUM 100 MG PO CAPS
100.0000 mg | ORAL_CAPSULE | Freq: Two times a day (BID) | ORAL | Status: DC
  Administered 2014-09-13 – 2014-09-15 (×4): 100 mg via ORAL
  Filled 2014-09-13 (×4): qty 1

## 2014-09-13 MED ORDER — ALBUTEROL SULFATE HFA 108 (90 BASE) MCG/ACT IN AERS: 1.0000 | INHALATION_SPRAY | RESPIRATORY_TRACT | Status: DC | PRN

## 2014-09-13 MED ORDER — CELECOXIB 200 MG PO CAPS
200.0000 mg | ORAL_CAPSULE | Freq: Two times a day (BID) | ORAL | Status: DC
  Administered 2014-09-13 – 2014-09-15 (×4): 200 mg via ORAL
  Filled 2014-09-13 (×4): qty 1

## 2014-09-13 MED ORDER — FENTANYL CITRATE (PF) 100 MCG/2ML IJ SOLN
INTRAMUSCULAR | Status: AC
  Administered 2014-09-13: 50 ug
  Filled 2014-09-13: qty 2

## 2014-09-13 MED ORDER — ROCURONIUM BROMIDE 50 MG/5ML IV SOLN
INTRAVENOUS | Status: AC
Start: 2014-09-13 — End: 2014-09-13
  Filled 2014-09-13: qty 1

## 2014-09-13 MED ORDER — PHENOL 1.4 % MT LIQD: 1.0000 | OROMUCOSAL | Status: DC | PRN

## 2014-09-13 MED ORDER — LORATADINE 10 MG PO TABS: 10.0000 mg | ORAL_TABLET | Freq: Every day | ORAL | Status: DC

## 2014-09-13 SURGICAL SUPPLY — 70 items
BANDAGE ESMARK 6X9 LF (GAUZE/BANDAGES/DRESSINGS) ×1 IMPLANT
BENZOIN TINCTURE PRP APPL 2/3 (GAUZE/BANDAGES/DRESSINGS) ×3 IMPLANT
BLADE SAGITTAL 25.0X1.19X90 (BLADE) ×2 IMPLANT
BLADE SAGITTAL 25.0X1.19X90MM (BLADE) ×1
BLADE SAW SGTL 11.0X1.19X90.0M (BLADE) IMPLANT
BLADE SAW SGTL 13.0X1.19X90.0M (BLADE) ×3 IMPLANT
BLADE SURG 10 STRL SS (BLADE) ×9 IMPLANT
BNDG ELASTIC 6X15 VLCR STRL LF (GAUZE/BANDAGES/DRESSINGS) ×3 IMPLANT
BNDG ESMARK 6X9 LF (GAUZE/BANDAGES/DRESSINGS) ×3
BOWL SMART MIX CTS (DISPOSABLE) ×3 IMPLANT
CAPT KNEE TOTAL 3 ATTUNE ×3 IMPLANT
CEMENT HV SMART SET (Cement) ×6 IMPLANT
CLOSURE WOUND 1/2 X4 (GAUZE/BANDAGES/DRESSINGS) ×1
COVER SURGICAL LIGHT HANDLE (MISCELLANEOUS) ×3 IMPLANT
CUFF TOURNIQUET SINGLE 34IN LL (TOURNIQUET CUFF) ×3 IMPLANT
CUFF TOURNIQUET SINGLE 44IN (TOURNIQUET CUFF) IMPLANT
DRAPE EXTREMITY T 121X128X90 (DRAPE) ×3 IMPLANT
DRAPE IMP U-DRAPE 54X76 (DRAPES) ×6 IMPLANT
DRAPE INCISE IOBAN 66X45 STRL (DRAPES) ×3 IMPLANT
DRAPE PROXIMA HALF (DRAPES) ×3 IMPLANT
DRAPE U-SHAPE 47X51 STRL (DRAPES) ×3 IMPLANT
DRSG AQUACEL AG ADV 3.5X14 (GAUZE/BANDAGES/DRESSINGS) ×3 IMPLANT
DRSG PAD ABDOMINAL 8X10 ST (GAUZE/BANDAGES/DRESSINGS) ×6 IMPLANT
DURAPREP 26ML APPLICATOR (WOUND CARE) ×3 IMPLANT
ELECT CAUTERY BLADE 6.4 (BLADE) ×3 IMPLANT
ELECT REM PT RETURN 9FT ADLT (ELECTROSURGICAL) ×3
ELECTRODE REM PT RTRN 9FT ADLT (ELECTROSURGICAL) ×1 IMPLANT
EVACUATOR 1/8 PVC DRAIN (DRAIN) ×3 IMPLANT
FACESHIELD WRAPAROUND (MASK) ×3 IMPLANT
GAUZE SPONGE 4X4 12PLY STRL (GAUZE/BANDAGES/DRESSINGS) ×3 IMPLANT
GLOVE BIO SURGEON STRL SZ7 (GLOVE) ×3 IMPLANT
GLOVE BIOGEL PI IND STRL 7.0 (GLOVE) ×1 IMPLANT
GLOVE BIOGEL PI IND STRL 7.5 (GLOVE) ×1 IMPLANT
GLOVE BIOGEL PI INDICATOR 7.0 (GLOVE) ×2
GLOVE BIOGEL PI INDICATOR 7.5 (GLOVE) ×2
GLOVE SS BIOGEL STRL SZ 7.5 (GLOVE) ×1 IMPLANT
GLOVE SUPERSENSE BIOGEL SZ 7.5 (GLOVE) ×2
GOWN STRL REUS W/ TWL LRG LVL3 (GOWN DISPOSABLE) ×1 IMPLANT
GOWN STRL REUS W/ TWL XL LVL3 (GOWN DISPOSABLE) ×2 IMPLANT
GOWN STRL REUS W/TWL LRG LVL3 (GOWN DISPOSABLE) ×2
GOWN STRL REUS W/TWL XL LVL3 (GOWN DISPOSABLE) ×4
HANDPIECE INTERPULSE COAX TIP (DISPOSABLE) ×2
HOOD PEEL AWAY FACE SHEILD DIS (HOOD) ×6 IMPLANT
IMMOBILIZER KNEE 22 UNIV (SOFTGOODS) ×3 IMPLANT
KIT BASIN OR (CUSTOM PROCEDURE TRAY) ×3 IMPLANT
KIT ROOM TURNOVER OR (KITS) ×3 IMPLANT
MANIFOLD NEPTUNE II (INSTRUMENTS) ×3 IMPLANT
MARKER SKIN DUAL TIP RULER LAB (MISCELLANEOUS) ×3 IMPLANT
NS IRRIG 1000ML POUR BTL (IV SOLUTION) ×3 IMPLANT
PACK TOTAL JOINT (CUSTOM PROCEDURE TRAY) ×3 IMPLANT
PACK UNIVERSAL I (CUSTOM PROCEDURE TRAY) ×3 IMPLANT
PAD ARMBOARD 7.5X6 YLW CONV (MISCELLANEOUS) ×6 IMPLANT
PADDING CAST COTTON 6X4 STRL (CAST SUPPLIES) ×3 IMPLANT
RUBBERBAND STERILE (MISCELLANEOUS) ×3 IMPLANT
SET HNDPC FAN SPRY TIP SCT (DISPOSABLE) ×1 IMPLANT
STRIP CLOSURE SKIN 1/2X4 (GAUZE/BANDAGES/DRESSINGS) ×2 IMPLANT
SUCTION FRAZIER TIP 10 FR DISP (SUCTIONS) ×3 IMPLANT
SUT ETHIBOND NAB CT1 #1 30IN (SUTURE) ×3 IMPLANT
SUT MNCRL AB 3-0 PS2 18 (SUTURE) ×3 IMPLANT
SUT VIC AB 0 CT1 27 (SUTURE) ×4
SUT VIC AB 0 CT1 27XBRD ANBCTR (SUTURE) ×2 IMPLANT
SUT VIC AB 2-0 CT1 27 (SUTURE) ×4
SUT VIC AB 2-0 CT1 TAPERPNT 27 (SUTURE) ×2 IMPLANT
SYR 30ML SLIP (SYRINGE) ×3 IMPLANT
TOWEL OR 17X24 6PK STRL BLUE (TOWEL DISPOSABLE) ×3 IMPLANT
TOWEL OR 17X26 10 PK STRL BLUE (TOWEL DISPOSABLE) ×3 IMPLANT
TRAY FOLEY CATH 16FR SILVER (SET/KITS/TRAYS/PACK) ×3 IMPLANT
TUBE CONNECTING 12'X1/4 (SUCTIONS) ×1
TUBE CONNECTING 12X1/4 (SUCTIONS) ×2 IMPLANT
YANKAUER SUCT BULB TIP NO VENT (SUCTIONS) ×3 IMPLANT

## 2014-09-13 NOTE — Anesthesia Preprocedure Evaluation (Addendum)
Anesthesia Evaluation  Patient identified by MRN, date of birth, ID band Patient awake    Reviewed: Allergy & Precautions, NPO status , Patient's Chart, lab work & pertinent test results  Airway Mallampati: II  TM Distance: >3 FB Neck ROM: Full    Dental   Pulmonary asthma , former smoker,  breath sounds clear to auscultation        Cardiovascular negative cardio ROS  Rhythm:Regular Rate:Normal     Neuro/Psych negative neurological ROS     GI/Hepatic negative GI ROS, Neg liver ROS,   Endo/Other  Morbid obesity  Renal/GU negative Renal ROS     Musculoskeletal  (+) Arthritis -,   Abdominal   Peds  Hematology negative hematology ROS (+)   Anesthesia Other Findings   Reproductive/Obstetrics                            Anesthesia Physical Anesthesia Plan  ASA: II  Anesthesia Plan: Regional and Spinal   Post-op Pain Management:    Induction: Intravenous  Airway Management Planned: Natural Airway and Simple Face Mask  Additional Equipment:   Intra-op Plan:   Post-operative Plan:   Informed Consent: I have reviewed the patients History and Physical, chart, labs and discussed the procedure including the risks, benefits and alternatives for the proposed anesthesia with the patient or authorized representative who has indicated his/her understanding and acceptance.     Plan Discussed with: CRNA  Anesthesia Plan Comments:        Anesthesia Quick Evaluation

## 2014-09-13 NOTE — Op Note (Signed)
MRN:     [id number] DOB/AGE:    [birth year] / 59 y.o.       OPERATIVE REPORT    DATE OF PROCEDURE:  09/13/2014       PREOPERATIVE DIAGNOSIS:   Primary localized OA right knee      Estimated body mass index is 34.41 kg/(m^2) as calculated from the following:   Height as of this encounter:  (1.549 m).   Weight as of this encounter: 82.555 kg (182 lb).                                                        POSTOPERATIVE DIAGNOSIS:   same                                                                     PROCEDURE:  Procedure(s): RIGHT TOTAL KNEE ARTHROPLASTY Using Depuy Attune RP implants #3 Femur, #5Tibia, 8mm attune RP bearing, 32 Patella     SURGEON: Bobbye Petti A    ASSISTANT:  Kirstin Shepperson PA-C   (Present and scrubbed throughout the case, critical for assistance with exposure, retraction, instrumentation, and closure.)         ANESTHESIA: GET with Femoral Nerve Block  DRAINS: foley, 2 medium hemovac in knee   TOURNIQUET TIME:   COMPLICATIONS:  None     SPECIMENS: None   INDICATIONS FOR PROCEDURE: The patient has  djd right knee, varus deformities, XR shows bone on bone arthritis. Patient has failed all conservative measures including anti-inflammatory medicines, narcotics, attempts at  exercise and weight loss, cortisone injections and viscosupplementation.  Risks and benefits of surgery have been discussed, questions answered.   DESCRIPTION OF PROCEDURE: The patient identified by armband, received  right femoral nerve block and IV antibiotics, in the holding area at [facility]. Patient taken to the operating room, appropriate anesthetic  monitors were attached General endotracheal anesthesia induced with  the patient in supine position, Foley catheter was inserted. Tourniquet  applied high to the operative thigh. Lateral post and foot positioner  applied to the table, the lower extremity was then prepped and draped  in usual sterile fashion from the  ankle to the tourniquet. Time-out procedure was performed. The limb was wrapped with an Esmarch bandage and the tourniquet inflated to 365 mmHg. We began the operation by making the anterior midline incision starting at handbreadth above the patella going over the patella 1 cm medial to and  4 cm distal to the tibial tubercle. Small bleeders in the skin and the  subcutaneous tissue identified and cauterized. Transverse retinaculum was incised and reflected medially and a medial parapatellar arthrotomy was accomplished. the patella was everted and theprepatellar fat pad resected. The superficial medial collateral  ligament was then elevated from anterior to posterior along the proximal  flare of the tibia and anterior half of the menisci resected. The knee was hyperflexed exposing bone on bone arthritis. Peripheral and notch osteophytes as well as the cruciate ligaments were then resected. We continued to  work our way around posteriorly along the proximal tibia, and externally  rotated the tibia subluxing it out from underneath the femur. A McHale  retractor was placed through the notch and a lateral Hohmann retractor  placed, and we then drilled through the proximal tibia in line with the  axis of the tibia followed by an intramedullary guide rod and 2-degree  posterior slope cutting guide. The tibial cutting guide was pinned into place  allowing resection of 4 mm of bone medially and about 6 mm of bone  laterally because of her varus deformity. Satisfied with the tibial resection, we then  entered the distal femur 2 mm anterior to the PCL origin with the  intramedullary guide rod and applied the distal femoral cutting guide  set at 11mm, with 5 degrees of valgus. This was pinned along the  epicondylar axis. At this point, the distal femoral cut was accomplished without difficulty. We then sized for a #3 femoral component and pinned the guide in 3 degrees of external rotation.The chamfer cutting  guide was pinned into place. The anterior, posterior, and chamfer cuts were accomplished without difficulty followed by  the Attune  cutting guide and the box cut. We also removed posterior osteophytes from the posterior femoral condyles. At this  time, the knee was brought into full extension. We checked our  extension and flexion gaps and found them symmetric at 8.  The patella thickness measured at 19m. We set the cutting guide at 10and removed the posterior 6mm  of the patella sized for 32utton and drilled the lollipop. The knee  was then once again hyperflexed exposing the proximal tibia. We sized for a #5tibial base plate, applied the smokestack and the conical reamer followed by the the Delta fin keel punch. We then hammered into place the Attuneial femoral component, inserted a 8mm trial bearing, trial patellar button, and took the knee through range of motion from 0-130 degrees. No thumb pressure was required for patellar  tracking. At this point, all trial components were removed, a double batch of DePuy HV cement was mixed and applied to all bony metallic mating surfaces except for the posterior condyles of the femur itself. In order, we  hammered into place the tibial tray and removed excess cement, the femoral component and removed excess cement, a 8mm Attunearing  was inserted, and the knee brought to full extension with compression.  The patellar button was clamped into place, and excess cement  removed. While the cement cured the wound was irrigated out with normal saline solution pulse lavage, and medium Hemovac drains were placed.. Ligament stability and patellar tracking were checked and found to be excellent. The tourniquet was then released and hemostasis was obtained with cautery. The parapatellar arthrotomy was closed with  #1 ethibond suture. The subcutaneous tissue with 0 and 2-0 undyed  Vicryl suture, and 4-0 Monocryl.. A dressing of Xeroform,  4 x 4, dressing sponges, Webril,  and Ace wrap applied. Needle and sponge count were correct times 2.The patient awakened, extubated, and taken to recovery room without difficulty. Vascular status was normal, pulses 2+ and symmetric.   Pascal Stiggers A 09/13/2014, 12:24 PM

## 2014-09-13 NOTE — Progress Notes (Signed)
Utilization review completed.  

## 2014-09-13 NOTE — Interval H&P Note (Signed)
History and Physical Interval Note:  09/13/2014 10:46 AM  Danielle Spears  has presented today for surgery, with the diagnosis of primary localized OA right knee  The various methods of treatment have been discussed with the patient and family. After consideration of risks, benefits and other options for treatment, the patient has consented to  Procedure(s): RIGHT TOTAL KNEE ARTHROPLASTY (Right) as a surgical intervention .  The patient's history has been reviewed, patient examined, no change in status, stable for surgery.  I have reviewed the patient's chart and labs.  Questions were answered to the patient's satisfaction.     Salvatore Marvel A

## 2014-09-13 NOTE — Progress Notes (Signed)
Orthopedic Tech Progress Note Patient Details:  Danielle Spears October 08, 1955 [id number] Applied CPM to RLE.  Applied OHF with trapeze to pt.'s bed. CPM Right Knee CPM Right Knee: On Right Knee Flexion (Degrees): 90 Right Knee Extension (Degrees): 0   [name] 09/13/2014, 2:43 PM

## 2014-09-13 NOTE — Anesthesia Procedure Notes (Addendum)
Anesthesia Regional Block:  Adductor canal block  Pre-Anesthetic Checklist: ,, timeout performed, Correct Patient, Correct Site, Correct Laterality, Correct Procedure, Correct Position, site marked, Risks and benefits discussed,  Surgical consent,  Pre-op evaluation,  At surgeon's request and post-op pain management  Laterality: Right  Prep: chloraprep       Needles:  Injection technique: Single-shot  Needle Type: Echogenic Needle     Needle Length: 9cm 9 cm Needle Gauge: 21 and 21 G    Additional Needles:  Procedures: ultrasound guided (picture in chart) Adductor canal block Narrative:  Start time: 09/13/2014 9:38 AM End time: 09/13/2014 9:45 AM Injection made incrementally with aspirations every 5 mL.  Events: injection painful  Performed by: Personally  Anesthesiologist: Suzette Battiest  Additional Notes: Risks and benefits discussed. Pt tolerated well with no immediate complications.   Spinal Patient location during procedure: OR Staffing Anesthesiologist: Suzette Battiest Performed by: anesthesiologist  Preanesthetic Checklist Completed: patient identified, site marked, surgical consent, pre-op evaluation, timeout performed, IV checked, risks and benefits discussed and monitors and equipment checked Spinal Block Patient position: sitting Prep: DuraPrep Patient monitoring: heart rate, continuous pulse ox, blood pressure and cardiac monitor Approach: midline Location: L4-5 Injection technique: single-shot Needle Needle type: Spinocan  Needle gauge: 24 G Needle length: 9 cm Assessment Events: paresthesia Additional Notes Expiration date of kit checked and confirmed. Patient tolerated procedure well, without complications. Transient paresthesia on needle insertion. Resolved prior to injection. CSF return before and after injection LA.    Procedure Name: LMA Insertion Date/Time: 09/13/2014 11:21 AM Performed by: Vennie Homans Pre-anesthesia Checklist:  Emergency Drugs available, Suction available, Timeout performed, Patient being monitored and Patient identified Patient Re-evaluated:Patient Re-evaluated prior to inductionOxygen Delivery Method: Circle system utilized Preoxygenation: Pre-oxygenation with 100% oxygen Intubation Type: IV induction LMA: LMA inserted LMA Size: 4.0 Number of attempts: 1 Placement Confirmation: positive ETCO2 and breath sounds checked- equal and bilateral Tube secured with: Tape Dental Injury: Teeth and Oropharynx as per pre-operative assessment

## 2014-09-13 NOTE — Transfer of Care (Signed)
Immediate Anesthesia Transfer of Care Note  Patient: Danielle Spears  Procedure(s) Performed: Procedure(s): RIGHT TOTAL KNEE ARTHROPLASTY (Right)  Patient Location: PACU  Anesthesia Type:GA combined with regional for post-op pain  Level of Consciousness: awake, alert , oriented, patient cooperative and responds to stimulation  Airway & Oxygen Therapy: Patient Spontanous Breathing and Patient connected to nasal cannula oxygen  Post-op Assessment: Report given to RN, Post -op Vital signs reviewed and stable and Patient able to stick tongue midline  Post vital signs: stable  Last Vitals:  Filed Vitals:   09/13/14 0955  BP:   Pulse: 77  Temp:   Resp: 14    Complications: No apparent anesthesia complications

## 2014-09-14 ENCOUNTER — Encounter (HOSPITAL_COMMUNITY): Payer: Self-pay | Admitting: Orthopedic Surgery

## 2014-09-14 LAB — BASIC METABOLIC PANEL
ANION GAP: 7 (ref 5–15)
BUN: 13 mg/dL (ref 6–20)
CALCIUM: 8.5 mg/dL — AB (ref 8.9–10.3)
CO2: 26 mmol/L (ref 22–32)
Chloride: 104 mmol/L (ref 101–111)
Creatinine, Ser: 0.79 mg/dL (ref 0.44–1.00)
GFR calc Af Amer: >60 mL/min (ref >60)
GLUCOSE: 141 mg/dL — AB (ref 65–99)
Potassium: 4.2 mmol/L (ref 3.5–5.1)
SODIUM: 137 mmol/L (ref 135–145)

## 2014-09-14 LAB — CBC
HCT: 33.2 % — ABNORMAL LOW (ref 36.0–46.0)
Hemoglobin: 11.3 g/dL — ABNORMAL LOW (ref 12.0–15.0)
MCH: 31 pg (ref 26.0–34.0)
MCHC: 34 g/dL (ref 30.0–36.0)
MCV: 91.2 fL (ref 78.0–100.0)
PLATELETS: 159 10*3/uL (ref 150–400)
RBC: 3.64 MIL/uL — ABNORMAL LOW (ref 3.87–5.11)
RDW: 13.2 % (ref 11.5–15.5)
WBC: 6.6 10*3/uL (ref 4.0–10.5)

## 2014-09-14 MED ORDER — FLUTICASONE-SALMETEROL 250-50 MCG/DOSE IN AEPB
1.0000 | INHALATION_SPRAY | Freq: Two times a day (BID) | RESPIRATORY_TRACT | Status: DC
  Administered 2014-09-14: 1 via RESPIRATORY_TRACT
  Filled 2014-09-14 (×2): qty 14

## 2014-09-14 MED ORDER — METOCLOPRAMIDE HCL 5 MG/ML IJ SOLN: 5.0000 mg | Freq: Three times a day (TID) | INTRAMUSCULAR | Status: AC

## 2014-09-14 MED ORDER — METOCLOPRAMIDE HCL 5 MG PO TABS
5.0000 mg | ORAL_TABLET | Freq: Three times a day (TID) | ORAL | Status: AC
  Administered 2014-09-14 (×3): 10 mg via ORAL
  Filled 2014-09-14 (×4): qty 2

## 2014-09-14 NOTE — Discharge Instructions (Signed)
Information on my medicine - ELIQUIS® (apixaban) ° °This medication education was reviewed with me or my healthcare representative as part of my discharge preparation.  The pharmacist that spoke with me during my hospital stay was:  Sandro Burgo K, RPH ° °Why was Eliquis® prescribed for you? °Eliquis® was prescribed for you to reduce the risk of blood clots forming after orthopedic surgery.   ° °What do You need to know about Eliquis®? °Take your Eliquis® TWICE DAILY - one tablet in the morning and one tablet in the evening with or without food.  It would be best to take the dose about the same time each day. ° °If you have difficulty swallowing the tablet whole please discuss with your pharmacist how to take the medication safely. ° °Take Eliquis® exactly as prescribed by your doctor and DO NOT stop taking Eliquis® without talking to the doctor who prescribed the medication.  Stopping without other medication to take the place of Eliquis® may increase your risk of developing a clot. ° °After discharge, you should have regular check-up appointments with your healthcare provider that is prescribing your Eliquis®. ° °What do you do if you miss a dose? °If a dose of ELIQUIS® is not taken at the scheduled time, take it as soon as possible on the same day and twice-daily administration should be resumed.  The dose should not be doubled to make up for a missed dose.  Do not take more than one tablet of ELIQUIS at the same time. ° °Important Safety Information °A possible side effect of Eliquis® is bleeding. You should call your healthcare provider right away if you experience any of the following: °? Bleeding from an injury or your nose that does not stop. °? Unusual colored urine (red or dark brown) or unusual colored stools (red or black). °? Unusual bruising for unknown reasons. °? A serious fall or if you hit your head (even if there is no bleeding). ° °Some medicines may interact with Eliquis® and might increase  your risk of bleeding or clotting while on Eliquis®. To help avoid this, consult your healthcare provider or pharmacist prior to using any new prescription or non-prescription medications, including herbals, vitamins, non-steroidal anti-inflammatory drugs (NSAIDs) and supplements. ° °This website has more information on Eliquis® (apixaban): http://www.eliquis.com/eliquis/home ° °

## 2014-09-14 NOTE — Progress Notes (Signed)
Physical Therapy Treatment Patient Details Name: Danielle Spears MRN: [id number] DOB: [birth year] Today's Date: 09/14/2014    History of Present Illness Rt TKA    PT Comments    Patient is making good progress with PT.  From a mobility standpoint anticipate patient will DC home with family assistance. Patient ambulating 125 feet in afternoon session. Will attempt stairs tomorrow.     Follow Up Recommendations  Home health PT     Equipment Recommendations  Rolling walker with 5" wheels    Recommendations for Other Services       Precautions / Restrictions Precautions Precautions: Knee;Fall Precaution Booklet Issued: Yes (comment) Precaution Comments: reviewed no pillow under knee and zero knee foam Required Braces or Orthoses: Knee Immobilizer - Right Knee Immobilizer - Right: Other (comment) Restrictions Weight Bearing Restrictions: Yes RLE Weight Bearing: Weight bearing as tolerated    Mobility  Bed Mobility Overal bed mobility: Needs Assistance Bed Mobility: Supine to Sit;Sit to Supine     Supine to sit: Supervision Sit to supine: Supervision   General bed mobility comments: supervision with Rt knee immobilizer  Transfers Overall transfer level: Needs assistance Equipment used: Rolling walker (2 wheeled) Transfers: Sit to/from Stand Sit to Stand: Min guard         General transfer comment: min guard for safety and cues for hand placement and technique  Ambulation/Gait Ambulation/Gait assistance: Min guard Ambulation Distance (Feet): 125 Feet Assistive device: Rolling walker (2 wheeled) Gait Pattern/deviations: Step-through pattern;Decreased stance time - right;Decreased weight shift to right Gait velocity: decreased   General Gait Details: cues for posture and even stride lengths   Stairs            Wheelchair Mobility    Modified Rankin (Stroke Patients Only)       Balance Overall balance assessment: Needs assistance Sitting-balance  support: No upper extremity supported Sitting balance-Leahy Scale: Good     Standing balance support: Single extremity supported Standing balance-Leahy Scale: Poor                      Cognition Arousal/Alertness: Awake/alert Behavior During Therapy: WFL for tasks assessed/performed Overall Cognitive Status: Within Functional Limits for tasks assessed                      Exercises Total Joint Exercises Ankle Circles/Pumps: AROM;Both;20 reps Quad Sets: Strengthening;Right;10 reps Heel Slides: AAROM;10 reps;Right Hip ABduction/ADduction: Strengthening;Right;10 reps Straight Leg Raises: Strengthening;Right;10 reps (min assist)    General Comments        Pertinent Vitals/Pain Pain Assessment: 0-10 Pain Score: 3  Faces Pain Scale: Hurts little more Pain Location: Rt knee Pain Descriptors / Indicators: Aching Pain Intervention(s): Monitored during session;Repositioned    Home Living Family/patient expects to be discharged to:: Private residence Living Arrangements: Spouse/significant other Available Help at Discharge: Family;Available PRN/intermittently Type of Home: House Home Access: Level entry (back entrance)   Home Layout: Able to live on main level with bedroom/bathroom Home Equipment: Crutches Additional Comments: Patient reports that her husband works.     Prior Function Level of Independence: Independent          PT Goals (current goals can now be found in the care plan section) Acute Rehab PT Goals Patient Stated Goal: be able to go to the beach PT Goal Formulation: With patient Time For Goal Achievement: 09/28/14 Potential to Achieve Goals: Good Progress towards PT goals: Progressing toward goals    Frequency  7X/week  PT Plan Current plan remains appropriate    Co-evaluation             End of Session Equipment Utilized During Treatment: Gait belt;Right knee immobilizer Activity Tolerance: Patient tolerated treatment  well Patient left: in bed;with call bell/phone within reach;with family/visitor present;in CPM     Time: 1532-1600 PT Time Calculation (min) (ACUTE ONLY): 28 min  Charges:  $Gait Training: 8-22 mins $Therapeutic Exercise: 8-22 mins                    G Codes:      Christiane Ha, PT, CSCS Pager [phone] Office [phone]  09/14/2014, 4:08 PM

## 2014-09-14 NOTE — Evaluation (Signed)
Physical Therapy Evaluation Patient Details Name: Danielle Spears MRN: [id number] DOB: 12-Nov-1955 Today's Date: 09/14/2014   History of Present Illness  Rt TKA  Clinical Impression  Pt is s/p TKA resulting in the deficits listed below (see PT Problem List). Pt will benefit from skilled PT to increase their independence and safety with mobility to allow discharge to the venue listed below. Will focus on mobility for anticipation of patient returning home upon D/C.      Follow Up Recommendations Home health PT    Equipment Recommendations  Rolling walker with 5" wheels    Recommendations for Other Services       Precautions / Restrictions Precautions Precautions: Knee;Fall Precaution Booklet Issued: Yes (comment) Precaution Comments: HEP Required Braces or Orthoses: Knee Immobilizer - Right Knee Immobilizer - Right: Other (comment) (not specified) Restrictions Weight Bearing Restrictions: Yes RLE Weight Bearing: Weight bearing as tolerated      Mobility  Bed Mobility                  Transfers Overall transfer level: Needs assistance Equipment used: Rolling walker (2 wheeled) Transfers: Sit to/from Stand Sit to Stand: Min assist         General transfer comment: cues for hand placement for safety  Ambulation/Gait Ambulation/Gait assistance: Min guard Ambulation Distance (Feet): 60 Feet Assistive device: Rolling walker (2 wheeled) Gait Pattern/deviations: Step-through pattern;Decreased step length - right;Decreased weight shift to right Gait velocity: decreased   General Gait Details: cues for posture  Stairs            Wheelchair Mobility    Modified Rankin (Stroke Patients Only)       Balance Overall balance assessment: Needs assistance Sitting-balance support: No upper extremity supported Sitting balance-Leahy Scale: Fair     Standing balance support: Bilateral upper extremity supported Standing balance-Leahy Scale: Poor Standing  balance comment: using rw for standing                             Pertinent Vitals/Pain Pain Assessment: 0-10 Pain Score: 3  Pain Location: Rt knee Pain Descriptors / Indicators: Aching Pain Intervention(s): Monitored during session;Limited activity within patient's tolerance    Home Living Family/patient expects to be discharged to:: Unsure Living Arrangements: Spouse/significant other   Type of Home: House Home Access: Stairs to enter Entrance Stairs-Rails: Right   Home Layout: Able to live on main level with bedroom/bathroom Home Equipment: Crutches;None Additional Comments: Patient reports that her husband does work.     Prior Function Level of Independence: Independent               Hand Dominance        Extremity/Trunk Assessment               Lower Extremity Assessment: RLE deficits/detail RLE Deficits / Details: unable to perform SLR independently       Communication   Communication: No difficulties  Cognition Arousal/Alertness: Awake/alert Behavior During Therapy: WFL for tasks assessed/performed Overall Cognitive Status: Within Functional Limits for tasks assessed                      General Comments      Exercises Total Joint Exercises Ankle Circles/Pumps: AROM;Both;20 reps Quad Sets: Strengthening;Right;10 reps Gluteal Sets: Strengthening;Both;10 reps Heel Slides: AAROM;10 reps;Right Straight Leg Raises: Strengthening;Right;10 reps (needing assist) Goniometric ROM: knee flexion 86      Assessment/Plan    PT  Assessment Patient needs continued PT services  PT Diagnosis Difficulty walking;Acute pain   PT Problem List Decreased strength;Decreased range of motion;Decreased activity tolerance;Decreased balance;Decreased mobility;Decreased knowledge of use of DME;Decreased knowledge of precautions;Pain  PT Treatment Interventions DME instruction;Gait training;Stair training;Functional mobility training;Therapeutic  activities;Therapeutic exercise;Balance training;Patient/family education   PT Goals (Current goals can be found in the Care Plan section) Acute Rehab PT Goals Patient Stated Goal: Be able to exercise again PT Goal Formulation: With patient Time For Goal Achievement: 09/28/14 Potential to Achieve Goals: Good    Frequency 7X/week   Barriers to discharge        Co-evaluation               End of Session Equipment Utilized During Treatment: Gait belt;Right knee immobilizer Activity Tolerance: Patient tolerated treatment well Patient left: in chair;with call bell/phone within reach;Other (comment) (in bone foam) Nurse Communication: Mobility status;Weight bearing status         Time: 1610-9604 PT Time Calculation (min) (ACUTE ONLY): 26 min   Charges:   PT Evaluation $Initial PT Evaluation Tier I: 1 Procedure PT Treatments $Gait Training: 8-22 mins   PT G Codes:        Christiane Ha, PT, CSCS Pager [phone] Office [phone]  09/14/2014, 11:30 AM

## 2014-09-14 NOTE — Progress Notes (Signed)
Subjective: 1 Day Post-Op Procedure(s) (LRB): RIGHT TOTAL KNEE ARTHROPLASTY (Right) Patient reports pain as 7 on 0-10 scale.   Some nausea  Objective: Vital signs in last 24 hours: Temp:  [97.1 F (36.2 C)-99 F (37.2 C)] 99 F (37.2 C) (08/23 0645) Pulse Rate:  [56-79] 60 (08/23 0645) Resp:  [11-23] 18 (08/23 0645) BP: (94-168)/(48-119) 120/69 mmHg (08/23 0645) SpO2:  [96 %-100 %] 97 % (08/23 0645) Weight:  [82.555 kg (182 lb)] 82.555 kg (182 lb) (08/22 0834)  Intake/Output from previous day: 08/22 0701 - 08/23 0700 In: 1850 [P.O.:300; I.V.:1550] Out: 1520 [Urine:1050; Drains:370; Blood:100] Intake/Output this shift:     Recent Labs  09/14/14 0512  HGB 11.3*    Recent Labs  09/14/14 0512  WBC 6.6  RBC 3.64*  HCT 33.2*  PLT 159    Recent Labs  09/14/14 0512  NA 137  K 4.2  CL 104  CO2 26  BUN 13  CREATININE 0.79  GLUCOSE 141*  CALCIUM 8.5*   No results for input(s): LABPT, INR in the last 72 hours.  ABD soft Neurovascular intact Sensation intact distally Intact pulses distally Dorsiflexion/Plantar flexion intact Incision: dressing C/D/I  Assessment/Plan: 1 Day Post-Op Procedure(s) (LRB): RIGHT TOTAL KNEE ARTHROPLASTY (Right)  Principal Problem:   Primary localized osteoarthritis of right knee Active Problems:   Asthma   Complication of anesthesia   DJD (degenerative joint disease) of knee Postoperative Nausea and vomiting  Advance diet Up with therapy D/C IV fluids  This patient is having difficulty with nausea     Will schedule reglan q 6hrs for the next 24 hrs as well as zofran and decadron.    Anajulia Leyendecker J 09/14/2014, 8:05 AM

## 2014-09-14 NOTE — Care Management Note (Signed)
Case Management Note  Patient Details  Name: Danielle Spears MRN: [id number] Date of Birth: 28-Feb-1955  Subjective/Objective:           S/p right total knee arthroplasty         Action/Plan: Spoke with patient about HHC, she chose [facility]. Contacted [facility] at [facility] and set up HHPT. Spoke with Kipp Brood with [employer], they will be delivering CPM to home and rolling walker and 3N1 to patient's room. Patient stated that her husband will be available to assist her after discharge.     Expected Discharge Date:                  Expected Discharge Plan:  Home w Home Health Services  In-House Referral:  NA  Discharge planning Services  CM Consult  Post Acute Care Choice:  Durable Medical Equipment, Home Health Choice offered to:  Patient  DME Arranged:  3-N-1, CPM, Walker rolling DME Agency:  [employer]  HH Arranged:  PT HH Agency:  [facility]  Status of Service:  Completed, signed off  Medicare Important Message Given:    Date Medicare IM Given:    Medicare IM give by:    Date Additional Medicare IM Given:    Additional Medicare Important Message give by:     If discussed at Long Length of Stay Meetings, dates discussed:    Additional Comments:  Monica Becton, RN 09/14/2014, 4:18 PM

## 2014-09-14 NOTE — Evaluation (Signed)
Occupational Therapy Evaluation Patient Details Name: Danielle Spears MRN: [id number] DOB: 05/15/1955 Today's Date: 09/14/2014    History of Present Illness Rt TKA   Clinical Impression   Patient presenting with decreased ADL, IADL, functional mobility independence secondary to above. Patient independent PTA. Patient currently requires up to min assist for LB ADLs and supervision->min guard for functional mobility/transfers using RW. Patient will benefit from acute OT to increase overall independence in the areas of ADLs, functional mobility, and overall safety in order to safely discharge home with intermittent assistance from husband.     Follow Up Recommendations  No OT follow up;Supervision - Intermittent    Equipment Recommendations  3 in 1 bedside comode;None recommended by OT (LH sponge, reacher)    Recommendations for Other Services  None at this time    Precautions / Restrictions Precautions Precautions: Knee;Fall Precaution Booklet Issued: Yes (comment) Precaution Comments: reviewed no pillow under knee and zero knee foam Required Braces or Orthoses: Knee Immobilizer - Right Knee Immobilizer - Right: Other (comment) (not specified ) Restrictions Weight Bearing Restrictions: Yes RLE Weight Bearing: Weight bearing as tolerated    Mobility Bed Mobility Overal bed mobility: Needs Assistance Bed Mobility: Supine to Sit;Sit to Supine     Supine to sit: Supervision Sit to supine: Supervision   General bed mobility comments: supervision for safety, pt able to manage RLE independently with minimal use of bed rails  Transfers Overall transfer level: Needs assistance Equipment used: Rolling walker (2 wheeled) Transfers: Sit to/from Stand Sit to Stand: Min guard General transfer comment: min guard for safety and cues for hand placement and technique    Balance Overall balance assessment: Needs assistance Sitting-balance support: No upper extremity supported;Feet  supported Sitting balance-Leahy Scale: Good     Standing balance support: Bilateral upper extremity supported;During functional activity Standing balance-Leahy Scale: Fair Standing balance comment: using rw for standing    ADL Overall ADL's : Needs assistance/impaired General ADL Comments: Pt min assist for LB ADLs and supervision> occasional min guard for functional mobility using RW. Pt engaged in bed mobility and ambulated <> BR for transfer onto [facility] over elevated toilet seat. Educated pt on use of 3-in-1 and pt interested in having one for in shower and possibly over toilet seat at home.     Pertinent Vitals/Pain Pain Assessment: Faces Pain Score: 3  Faces Pain Scale: Hurts little more Pain Location: right knee during movement Pain Descriptors / Indicators: Grimacing;Discomfort;Guarding Pain Intervention(s): Monitored during session;Repositioned     Hand Dominance Right   Extremity/Trunk Assessment Upper Extremity Assessment Upper Extremity Assessment: Overall WFL for tasks assessed   Lower Extremity Assessment Lower Extremity Assessment: Defer to PT evaluation RLE Deficits / Details: unable to perform SLR independently   Cervical / Trunk Assessment Cervical / Trunk Assessment: Normal   Communication Communication Communication: No difficulties   Cognition Arousal/Alertness: Awake/alert Behavior During Therapy: WFL for tasks assessed/performed Overall Cognitive Status: Within Functional Limits for tasks assessed              Home Living Family/patient expects to be discharged to:: Private residence Living Arrangements: Spouse/significant other Available Help at Discharge: Family;Available PRN/intermittently Type of Home: House Home Access: Level entry (back entrance)   Entrance Stairs-Rails: Right Home Layout: Able to live on main level with bedroom/bathroom     Bathroom Shower/Tub: Walk-in shower;Door   [employer] Toilet: Standard     Home Equipment:  Crutches   Additional Comments: Patient reports that her husband works.  Prior Functioning/Environment Level of Independence: Independent     OT Diagnosis: Generalized weakness;Acute pain   OT Problem List: Decreased strength;Decreased range of motion;Decreased activity tolerance;Impaired balance (sitting and/or standing);Decreased safety awareness;Decreased knowledge of use of DME or AE;Decreased knowledge of precautions;Pain   OT Treatment/Interventions: Self-care/ADL training;Therapeutic exercise;Energy conservation;DME and/or AE instruction;Patient/family education;Balance training    OT Goals(Current goals can be found in the care plan section) Acute Rehab OT Goals Patient Stated Goal: be able to go home if safe to OT Goal Formulation: With patient/family Time For Goal Achievement: 09/28/14 Potential to Achieve Goals: Good ADL Goals Pt Will Perform Grooming: with modified independence;standing Pt Will Perform Upper Body Bathing: with modified independence;sitting Pt Will Perform Lower Body Bathing: with modified independence;sit to/from stand Pt Will Perform Upper Body Dressing: with modified independence;sitting Pt Will Perform Lower Body Dressing: with modified independence;sit to/from stand Pt Will Transfer to Toilet: with modified independence;ambulating;bedside commode Pt Will Perform Tub/Shower Transfer: Shower transfer;3 in 1;rolling walker;ambulating;with modified independence Additional ADL Goal #1: Pt will independently verbalize and adhere to knee precautions 100% of the time  OT Frequency: Min 2X/week   Barriers to D/C: Decreased caregiver support   End of Session Equipment Utilized During Treatment: Rolling walker;Right knee immobilizer CPM Right Knee CPM Right Knee: Off Right Knee Flexion (Degrees): 90 Right Knee Extension (Degrees): 0 Additional Comments: zero knee foam donned  Activity Tolerance: Patient tolerated treatment well Patient left: in  bed;with call bell/phone within reach;with family/visitor present   Time: 4098-1191 OT Time Calculation (min): 21 min Charges:  OT General Charges $OT Visit: 1 Procedure OT Evaluation $Initial OT Evaluation Tier I: 1 Procedure  Danielle Spears , MS, OTR/L, CLT Pager: [phone]  09/14/2014, 2:16 PM

## 2014-09-15 LAB — CBC
HCT: 30.1 % — ABNORMAL LOW (ref 36.0–46.0)
HEMOGLOBIN: 10.3 g/dL — AB (ref 12.0–15.0)
MCH: 31.1 pg (ref 26.0–34.0)
MCHC: 34.2 g/dL (ref 30.0–36.0)
MCV: 90.9 fL (ref 78.0–100.0)
Platelets: 171 10*3/uL (ref 150–400)
RBC: 3.31 MIL/uL — ABNORMAL LOW (ref 3.87–5.11)
RDW: 13.2 % (ref 11.5–15.5)
WBC: 9.2 10*3/uL (ref 4.0–10.5)

## 2014-09-15 LAB — BASIC METABOLIC PANEL
Anion gap: 6 (ref 5–15)
BUN: 13 mg/dL (ref 6–20)
CALCIUM: 8.5 mg/dL — AB (ref 8.9–10.3)
CO2: 28 mmol/L (ref 22–32)
CREATININE: 0.63 mg/dL (ref 0.44–1.00)
Chloride: 102 mmol/L (ref 101–111)
GFR calc non Af Amer: >60 mL/min (ref >60)
Glucose, Bld: 150 mg/dL — ABNORMAL HIGH (ref 65–99)
Potassium: 4.3 mmol/L (ref 3.5–5.1)
SODIUM: 136 mmol/L (ref 135–145)

## 2014-09-15 MED ORDER — OXYCODONE HCL 5 MG PO TABS: 5.0000 mg | ORAL_TABLET | ORAL | Status: DC | PRN

## 2014-09-15 MED ORDER — OXYCODONE HCL ER 20 MG PO T12A: 20.0000 mg | EXTENDED_RELEASE_TABLET | Freq: Two times a day (BID) | ORAL | Status: DC

## 2014-09-15 MED ORDER — POLYETHYLENE GLYCOL 3350 17 G PO PACK: PACK | ORAL | Status: DC

## 2014-09-15 MED ORDER — DOCUSATE SODIUM 100 MG PO CAPS
ORAL_CAPSULE | ORAL | Status: DC
Start: 2014-09-15 — End: 2015-04-04

## 2014-09-15 MED ORDER — APIXABAN 2.5 MG PO TABS: ORAL_TABLET | ORAL | Status: DC

## 2014-09-15 NOTE — Progress Notes (Signed)
Physical Therapy Treatment Patient Details Name: Danielle Spears MRN: [id number] DOB: [birth year] Today's Date: 09/15/2014    History of Present Illness Rt TKA    PT Comments    Patient is making good progress with PT.  From a mobility standpoint anticipate patient will be ready for DC home.     Follow Up Recommendations  Home health PT     Equipment Recommendations  Rolling walker with 5" wheels    Recommendations for Other Services       Precautions / Restrictions Precautions Precautions: Knee;Fall Precaution Booklet Issued: Yes (comment) Precaution Comments: reviewed HEP Restrictions Weight Bearing Restrictions: Yes RLE Weight Bearing: Weight bearing as tolerated    Mobility  Bed Mobility                  Transfers Overall transfer level: Needs assistance Equipment used: Rolling walker (2 wheeled) Transfers: Sit to/from Stand Sit to Stand: Supervision            Ambulation/Gait Ambulation/Gait assistance: Supervision Ambulation Distance (Feet): 150 Feet Assistive device: Rolling walker (2 wheeled) Gait Pattern/deviations: Step-through pattern Gait velocity: decreased   General Gait Details: cues for posture and even stride lengths knee flexion/extension during gait phases   Stairs            Wheelchair Mobility    Modified Rankin (Stroke Patients Only)       Balance Overall balance assessment: Needs assistance Sitting-balance support: No upper extremity supported Sitting balance-Leahy Scale: Good     Standing balance support: No upper extremity supported Standing balance-Leahy Scale: Fair                      Cognition Arousal/Alertness: Awake/alert Behavior During Therapy: WFL for tasks assessed/performed Overall Cognitive Status: Within Functional Limits for tasks assessed                      Exercises Total Joint Exercises Ankle Circles/Pumps: AROM;Both;20 reps Quad Sets: Strengthening;Right;10  reps Short Arc Quad: Strengthening;Right;10 reps Heel Slides: AAROM;10 reps;Right Hip ABduction/ADduction: Strengthening;Right;10 reps Straight Leg Raises: Strengthening;Right;10 reps Long Arc Quad: Strengthening;Right;5 reps Knee Flexion: Seated;5 reps;Right Goniometric ROM: 111 degrees flexion    General Comments        Pertinent Vitals/Pain Pain Assessment: 0-10 Pain Score: 2  Pain Location: Rt knee Pain Descriptors / Indicators: Sore Pain Intervention(s): Monitored during session    Home Living                      Prior Function            PT Goals (current goals can now be found in the care plan section) Acute Rehab PT Goals Patient Stated Goal: get home this afternoon.  PT Goal Formulation: With patient Time For Goal Achievement: 09/28/14 Potential to Achieve Goals: Good Progress towards PT goals: Progressing toward goals    Frequency  7X/week    PT Plan Current plan remains appropriate    Co-evaluation             End of Session Equipment Utilized During Treatment: Gait belt Activity Tolerance: Patient tolerated treatment well Patient left: in bed;in CPM;with family/visitor present     Time: 7829-5621 PT Time Calculation (min) (ACUTE ONLY): 37 min  Charges:  $Gait Training: 8-22 mins $Therapeutic Exercise: 8-22 mins                    G Codes:  Christiane Ha, PT, CSCS Pager [phone] Office [phone]  09/15/2014, 5:27 PM

## 2014-09-15 NOTE — Progress Notes (Signed)
Occupational Therapy Treatment Patient Details Name: Danielle Spears MRN: [id number] DOB: [birth year] Today's Date: 09/15/2014    History of present illness Rt TKA   OT comments  Pt progressing. Education provided and feel pt is safe to d/c home, from OT standpoint.  Follow Up Recommendations  No OT follow up;Supervision - Intermittent    Equipment Recommendations  3 in 1 bedside comode    Recommendations for Other Services      Precautions / Restrictions Precautions Precautions: Knee;Fall Precaution Booklet Issued: No Precaution Comments: educated on knee precautions Restrictions Weight Bearing Restrictions: Yes RLE Weight Bearing: Weight bearing as tolerated       Mobility Bed Mobility               General bed mobility comments: not assessed  Transfers Overall transfer level: Needs assistance Transfers: Sit to/from Stand Sit to Stand: Supervision              Balance No LOB in session.                 ADL Overall ADL's : Needs assistance/impaired     Grooming: Oral care;Independent;Standing (items already at sink; suspect supervision level to retrieve)               Lower Body Dressing: Sitting/lateral leans; Modified Independent (donned/doffed right sock )   Toilet Transfer: Supervision/safety;Ambulation;RW (sit to stand from chair and 3 in 1.)       Tub/ Shower Transfer: Walk-in shower;Min guard;Ambulation;3 in 1;Rolling walker   Functional mobility during ADLs: Supervision/safety;Rolling walker General ADL Comments: Educated on safety such as safe footwear, use of bag on walker, and rugs/items on floor. Educated on shower transfer techniques and pt practiced simulated shower transfer. Educated on use of bone foam. Pt able to reach to don/doff right sock and OT explained benefit of reaching to right foot as it allows knee to bend.  Discussed use of reacher. Pt able to move her UEs with no difficulty.      Vision                     Perception     Praxis      Cognition  Awake/Alert Behavior During Therapy: WFL for tasks assessed/performed Overall Cognitive Status: Within Functional Limits for tasks assessed                       Extremity/Trunk Assessment                  Shoulder Instructions       General Comments      Pertinent Vitals/ Pain       Pain Assessment: 0-10 Pain Score: 4  Pain Location: right calf and knee Pain Descriptors / Indicators: Spasm Pain Intervention(s): Monitored during session  Home Living                                          Prior Functioning/Environment              Frequency Min 2X/week     Progress Toward Goals  OT Goals(current goals can now be found in the care plan section)  Progress towards OT goals: Progressing toward goals-adequate for d/c  Acute Rehab OT Goals Patient Stated Goal: not stated OT Goal Formulation: With patient/family Time For Goal Achievement: 09/28/14 Potential to  Achieve Goals: Good ADL Goals Pt Will Perform Grooming: with modified independence;standing Pt Will Perform Upper Body Bathing: with modified independence;sitting Pt Will Perform Lower Body Bathing: with modified independence;sit to/from stand Pt Will Perform Upper Body Dressing: with modified independence;sitting Pt Will Perform Lower Body Dressing: with modified independence;sit to/from stand Pt Will Transfer to Toilet: with modified independence;ambulating;bedside commode Pt Will Perform Tub/Shower Transfer: Shower transfer;3 in 1;rolling walker;ambulating;with modified independence Additional ADL Goal #1: Pt will independently verbalize and adhere to knee precautions 100% of the time  Plan Discharge plan remains appropriate    Co-evaluation                 End of Session Equipment Utilized During Treatment: Gait belt;Rolling walker   Activity Tolerance Patient tolerated treatment well   Patient Left in  chair;with call bell/phone within reach   Nurse Communication          Time: 4098-1191 OT Time Calculation (min): 19 min  Charges: OT General Charges $OT Visit: 1 Procedure OT Treatments $Self Care/Home Management : 8-22 mins  Earlie Raveling OTR/L [phone] 09/15/2014, 11:19 AM

## 2014-09-15 NOTE — Progress Notes (Signed)
Physical Therapy Treatment Patient Details Name: Danielle Spears MRN: [id number] DOB: 12-30-1955 Today's Date: 09/15/2014    History of Present Illness Rt TKA    PT Comments    Patient is making good progress with PT.  From a mobility standpoint anticipate patient will be ready for DC home with family assistance.     Follow Up Recommendations  Home health PT     Equipment Recommendations  Rolling walker with 5" wheels    Recommendations for Other Services       Precautions / Restrictions Precautions Precautions: Knee;Fall Precaution Booklet Issued: Yes (comment) Knee Immobilizer - Right:  (patient reports not having to use with PA-C this am.) Restrictions Weight Bearing Restrictions: Yes RLE Weight Bearing: Weight bearing as tolerated    Mobility  Bed Mobility                  Transfers Overall transfer level: Needs assistance Equipment used: Rolling walker (2 wheeled) Transfers: Sit to/from Stand Sit to Stand: Min guard            Ambulation/Gait Ambulation/Gait assistance: Min guard Ambulation Distance (Feet): 125 Feet Assistive device: Rolling walker (2 wheeled) Gait Pattern/deviations: Step-through pattern;Decreased weight shift to right Gait velocity: decreased   General Gait Details: cues for posture and even stride lengths   Stairs Stairs: Yes Stairs assistance: Min assist Stair Management: One rail Right;Sideways Number of Stairs: 4 General stair comments: reports feeling confident with stairs now  Wheelchair Mobility    Modified Rankin (Stroke Patients Only)       Balance Overall balance assessment: Needs assistance Sitting-balance support: No upper extremity supported Sitting balance-Leahy Scale: Good     Standing balance support: No upper extremity supported Standing balance-Leahy Scale: Fair                      Cognition Arousal/Alertness: Awake/alert Behavior During Therapy: WFL for tasks  assessed/performed Overall Cognitive Status: Within Functional Limits for tasks assessed                      Exercises Total Joint Exercises Ankle Circles/Pumps: AROM;Both;20 reps Quad Sets: Strengthening;Right;10 reps Short Arc Quad: Strengthening;Right;10 reps (mod assist) Heel Slides: AAROM;10 reps;Right Hip ABduction/ADduction: Strengthening;Right;10 reps Knee Flexion: Seated;5 reps;Right Goniometric ROM: 89 degrees    General Comments        Pertinent Vitals/Pain Pain Assessment: 0-10 Pain Score: 3  Pain Location: Rt knee Pain Descriptors / Indicators: Cramping;Sore Pain Intervention(s): Monitored during session;Repositioned    Home Living                      Prior Function            PT Goals (current goals can now be found in the care plan section) Acute Rehab PT Goals Patient Stated Goal: be able to go to the beach PT Goal Formulation: With patient Time For Goal Achievement: 09/28/14 Potential to Achieve Goals: Good Progress towards PT goals: Progressing toward goals    Frequency  7X/week    PT Plan Current plan remains appropriate    Co-evaluation             End of Session Equipment Utilized During Treatment: Gait belt Activity Tolerance: Patient tolerated treatment well Patient left: in chair;with call bell/phone within reach;Other (comment) (in bone foam)     Time: 0825-0903 PT Time Calculation (min) (ACUTE ONLY): 38 min  Charges:  $Gait Training: 23-37 mins $Therapeutic  Exercise: 8-22 mins                    G Codes:      Christiane Ha, PT, CSCS Pager [phone] Office [phone]   09/15/2014, 9:53 AM

## 2014-09-15 NOTE — Progress Notes (Signed)
Danielle Spears discharged home per MD order. Discharge instructions reviewed and discussed with patient. All questions and concerns answered. Copy of instructions and scripts given to patient. IV removed.  Patient escorted to car by staff in a wheelchair. No distress noted upon discharge.   Rosita Fire 09/15/2014 6:14 PM

## 2014-09-15 NOTE — Discharge Summary (Signed)
Patient ID: Danielle Spears MRN: [id number] DOB/AGE: [id number] y.o.  Admit date: 09/13/2014 Discharge date: 09/15/2014  Admission Diagnoses:  Principal Problem:   Primary localized osteoarthritis of right knee Active Problems:   Asthma   Complication of anesthesia   DJD (degenerative joint disease) of knee   Discharge Diagnoses:  Same  Past Medical History  Diagnosis Date  . Asthma   . Seasonal allergies   . Primary localized osteoarthritis of right knee   . Complication of anesthesia     Bradycardia to 40's during knee surgery 2014; received atropine 0.1mg  x2, HR increased to 50's; no further brady episodes    Surgeries: Procedure(s): RIGHT TOTAL KNEE ARTHROPLASTY on 09/13/2014   Consultants:    Discharged Condition: Improved  Hospital Course: Danielle Spears is an 59 y.o. female who was admitted 09/13/2014 for operative treatment ofPrimary localized osteoarthritis of right knee. Patient has severe unremitting pain that affects sleep, daily activities, and work/hobbies. After pre-op clearance the patient was taken to the operating room on 09/13/2014 and underwent  Procedure(s): RIGHT TOTAL KNEE ARTHROPLASTY.    Patient was given perioperative antibiotics: Anti-infectives    Start     Dose/Rate Route Frequency Ordered Stop   09/13/14 1700  ceFAZolin (ANCEF) IVPB 2 g/50 mL premix     2 g 100 mL/hr over 30 Minutes Intravenous Every 6 hours 09/13/14 1514 09/14/14 0019   09/13/14 1030  ceFAZolin (ANCEF) IVPB 2 g/50 mL premix     2 g 100 mL/hr over 30 Minutes Intravenous To [facility] 09/10/14 1217 09/13/14 1055       Patient was given sequential compression devices, early ambulation, and chemoprophylaxis to prevent DVT.  Patient benefited maximally from hospital stay and there were no complications.    Recent vital signs: Patient Vitals for the past 24 hrs:  BP Temp Temp src Pulse Resp SpO2  09/14/14 2043 119/64 mmHg 98.4 F (36.9 C) Oral 67 18 98 %   09/14/14 1343 124/67 mmHg - Oral 71 18 98 %     Recent laboratory studies:  Recent Labs  09/14/14 0512 09/15/14 0419  WBC 6.6 9.2  HGB 11.3* 10.3*  HCT 33.2* 30.1*  PLT 159 171  NA 137 136  K 4.2 4.3  CL 104 102  CO2 26 28  BUN 13 13  CREATININE 0.79 0.63  GLUCOSE 141* 150*  CALCIUM 8.5* 8.5*     Discharge Medications:     Medication List    STOP taking these medications        fish oil-omega-3 fatty acids 1000 MG capsule     ibuprofen 200 MG tablet  Commonly known as:  ADVIL,MOTRIN     oxyCODONE-acetaminophen 7.5-325 MG per tablet  Commonly known as:  PERCOCET      TAKE these medications        ADVAIR DISKUS 250-50 MCG/DOSE Aepb  Generic drug:  Fluticasone-Salmeterol  USE 1 INHALATION ORALLY EVERY 12 HOURS     ALPRAZolam 0.5 MG tablet  Commonly known as:  XANAX  TAKE ONE-HALF TO ONE TABLET BY MOUTH THREE TIMES DAILY AS NEEDED FOR ANXIOUSNESS     apixaban 2.5 MG Tabs tablet  Commonly known as:  ELIQUIS  1 tablet every 12 hrs for 2 weeks to prevent blood clots     b complex vitamins tablet  Take 1 tablet by mouth daily.     cetirizine 10 MG tablet  Commonly known as:  ZYRTEC  Take 10 mg by mouth daily.  docusate sodium 100 MG capsule  Commonly known as:  COLACE  1 tab 2 times a day while on narcotics.  STOOL SOFTENER     multivitamin with minerals tablet  Take 1 tablet by mouth daily.     oxyCODONE 5 MG immediate release tablet  Commonly known as:  Oxy IR/ROXICODONE  Take 1-2 tablets (5-10 mg total) by mouth every 4 (four) hours as needed for breakthrough pain.     OxyCODONE 20 mg T12a 12 hr tablet  Commonly known as:  OXYCONTIN  Take 1 tablet (20 mg total) by mouth every 12 (twelve) hours.     polyethylene glycol packet  Commonly known as:  MIRALAX / GLYCOLAX  17grams in 16 oz of water twice a day until bowel movement.  LAXITIVE.  Restart if two days since last bowel movement     albuterol 108 (90 BASE) MCG/ACT inhaler  Commonly  known as:  PROVENTIL HFA;VENTOLIN HFA  Inhale 2 puffs into the lungs every 4 (four) hours as needed for wheezing or shortness of breath.     VENTOLIN HFA 108 (90 BASE) MCG/ACT inhaler  Generic drug:  albuterol  INHALE TWO PUFFS BY MOUTH EVERY 4 HOURS AS NEEDED     Vitamin D3 1000 UNITS Caps  Take 1,000 Units by mouth daily.        Diagnostic Studies: No results found.  Disposition: 01-Home or Self Care      Discharge Instructions    CPM    Complete by:  As directed   Continuous passive motion machine (CPM):      Use the CPM from 0 to 90 for 6 hours per day.       You may break it up into 2 or 3 sessions per day.      Use CPM for 2 weeks or until you are told to stop.     Call MD / Call 911    Complete by:  As directed   If you experience chest pain or shortness of breath, CALL 911 and be transported to the hospital emergency room.  If you develope a fever above 101 F, pus (white drainage) or increased drainage or redness at the wound, or calf pain, call your surgeon's office.     Change dressing    Complete by:  As directed   Change the gauze dressing daily with sterile 4 x 4 inch gauze and apply TED hose.  DO NOT REMOVE BANDAGE OVER SURGICAL INCISION.  WASH WHOLE LEG INCLUDING OVER THE WATERPROOF BANDAGE WITH SOAP AND WATER EVERY DAY.     Constipation Prevention    Complete by:  As directed   Drink plenty of fluids.  Prune juice may be helpful.  You may use a stool softener, such as Colace (over the counter) 100 mg twice a day.  Use MiraLax (over the counter) for constipation as needed.     Diet - low sodium heart healthy    Complete by:  As directed      Discharge instructions    Complete by:  As directed   INSTRUCTIONS AFTER JOINT REPLACEMENT   Remove items at home which could result in a fall. This includes throw rugs or furniture in walking pathways ICE to the affected joint every three hours while awake for 30 minutes at a time, for at least the first 3-5 days, and then  as needed for pain and swelling.  Continue to use ice for pain and swelling. You may notice swelling that  will progress down to the foot and ankle.  This is normal after surgery.  Elevate your leg when you are not up walking on it.   Continue to use the breathing machine you got in the hospital (incentive spirometer) which will help keep your temperature down.  It is common for your temperature to cycle up and down following surgery, especially at night when you are not up moving around and exerting yourself.  The breathing machine keeps your lungs expanded and your temperature down.   DIET:  As you were doing prior to hospitalization, we recommend a well-balanced diet.  DRESSING / WOUND CARE / SHOWERING  Keep the surgical dressing until follow up.  The dressing is water proof, so you can shower without any extra covering.  IF THE DRESSING FALLS OFF or the wound gets wet inside, change the dressing with sterile gauze.  Please use good hand washing techniques before changing the dressing.  Do not use any lotions or creams on the incision until instructed by your surgeon.    ACTIVITY  Increase activity slowly as tolerated, but follow the weight bearing instructions below.   No driving for 6 weeks or until further direction given by your physician.  You cannot drive while taking narcotics.  No lifting or carrying greater than 10 lbs. until further directed by your surgeon. Avoid periods of inactivity such as sitting longer than an hour when not asleep. This helps prevent blood clots.  You may return to work once you are authorized by your doctor.     WEIGHT BEARING   Weight bearing as tolerated with assist device (walker, cane, etc) as directed, use it as long as suggested by your surgeon or therapist, typically at least 2-4 weeks   EXERCISES  Results after joint replacement surgery are often greatly improved when you follow the exercise, range of motion and muscle strengthening exercises  prescribed by your doctor. Safety measures are also important to protect the joint from further injury. Any time any of these exercises cause you to have increased pain or swelling, decrease what you are doing until you are comfortable again and then slowly increase them. If you have problems or questions, call your caregiver or physical therapist for advice.   Rehabilitation is important following a joint replacement. After just a few days of immobilization, the muscles of the leg can become weakened and shrink (atrophy).  These exercises are designed to build up the tone and strength of the thigh and leg muscles and to improve motion. Often times heat used for twenty to thirty minutes before working out will loosen up your tissues and help with improving the range of motion but do not use heat for the first two weeks following surgery (sometimes heat can increase post-operative swelling).   These exercises can be done on a training (exercise) mat, on the floor, on a table or on a bed. Use whatever works the best and is most comfortable for you.    Use music or television while you are exercising so that the exercises are a pleasant break in your day. This will make your life better with the exercises acting as a break in your routine that you can look forward to.   Perform all exercises about fifteen times, three times per day or as directed.  You should exercise both the operative leg and the other leg as well.   Exercises include:   Quad Sets - Tighten up the muscle on the front of the  thigh (Quad) and hold for 5-10 seconds.   Straight Leg Raises - With your knee straight (if you were given a brace, keep it on), lift the leg to 60 degrees, hold for 3 seconds, and slowly lower the leg.  Perform this exercise against resistance later as your leg gets stronger.  Leg Slides: Lying on your back, slowly slide your foot toward your buttocks, bending your knee up off the floor (only go as far as is  comfortable). Then slowly slide your foot back down until your leg is flat on the floor again.  Angel Wings: Lying on your back spread your legs to the side as far apart as you can without causing discomfort.  Hamstring Strength:  Lying on your back, push your heel against the floor with your leg straight by tightening up the muscles of your buttocks.  Repeat, but this time bend your knee to a comfortable angle, and push your heel against the floor.  You may put a pillow under the heel to make it more comfortable if necessary.   A rehabilitation program following joint replacement surgery can speed recovery and prevent re-injury in the future due to weakened muscles. Contact your doctor or a physical therapist for more information on knee rehabilitation.    CONSTIPATION  Constipation is defined medically as fewer than three stools per week and severe constipation as less than one stool per week.  Even if you have a regular bowel pattern at home, your normal regimen is likely to be disrupted due to multiple reasons following surgery.  Combination of anesthesia, postoperative narcotics, change in appetite and fluid intake all can affect your bowels.   YOU MUST use at least one of the following options; they are listed in order of increasing strength to get the job done.  They are all available over the counter, and you may need to use some, POSSIBLY even all of these options:    Drink plenty of fluids (prune juice may be helpful) and high fiber foods Colace 100 mg by mouth twice a day  Senokot for constipation as directed and as needed Dulcolax (bisacodyl), take with full glass of water  Miralax (polyethylene glycol) once or twice a day as needed.  If you have tried all these things and are unable to have a bowel movement in the first 3-4 days after surgery call either your surgeon or your primary doctor.    If you experience loose stools or diarrhea, hold the medications until you stool forms back  up.  If your symptoms do not get better within 1 week or if they get worse, check with your doctor.  If you experience "the worst abdominal pain ever" or develop nausea or vomiting, please contact the office immediately for further recommendations for treatment.   ITCHING:  If you experience itching with your medications, try taking only a single pain pill, or even half a pain pill at a time.  You can also use Benadryl over the counter for itching or also to help with sleep.   TED HOSE STOCKINGS:  Use stockings on both legs until for at least 2 weeks or as directed by physician office. They may be removed at night for sleeping.  MEDICATIONS:  See your medication summary on the "After Visit Summary" that nursing will review with you.  You may have some home medications which will be placed on hold until you complete the course of blood thinner medication.  It is important for you to complete  the blood thinner medication as prescribed.  PRECAUTIONS:  If you experience chest pain or shortness of breath - call 911 immediately for transfer to the hospital emergency department.   If you develop a fever greater that 101 F, purulent drainage from wound, increased redness or drainage from wound, foul odor from the wound/dressing, or calf pain - CONTACT YOUR SURGEON.                                                   FOLLOW-UP APPOINTMENTS:  If you do not already have a post-op appointment, please call the office for an appointment to be seen by your surgeon.  Guidelines for how soon to be seen are listed in your "After Visit Summary", but are typically between 1-4 weeks after surgery.  OTHER INSTRUCTIONS:   Knee Replacement:  Do not place pillow under knee, focus on keeping the knee straight while resting. CPM instructions: 0-90 degrees, 2 hours in the morning, 2 hours in the afternoon, and 2 hours in the evening. Place foam block, curve side up under heel at all times except when in CPM or when walking.  DO  NOT modify, tear, cut, or change the foam block in any way.  MAKE SURE YOU:  Understand these instructions.  Get help right away if you are not doing well or get worse.    Thank you for letting us be a part of your medical care team.  It is a privilege we respect greatly.  We hope these instructions will help you stay on track for a fast and full recovery!     Do not put a pillow under the knee. Place it under the heel.    Complete by:  As directed   Place gray foam block, curve side up under heel at all times except when in CPM or when walking.  DO NOT modify, tear, cut, or change in any way the gray foam block.     Increase activity slowly as tolerated    Complete by:  As directed      TED hose    Complete by:  As directed   Use stockings (TED hose) for 2 weeks on both leg(s).  You may remove them at night for sleeping.           Follow-up Information    Follow up with Nilda Simmer, MD On 09/28/2014.   Specialty:  Orthopedic Surgery   Why:  appt time 3:20 pm   Contact information:   [address] [place] [address] [phone]       Follow up with [facility].   Why:  They will contact you to schedule home therapy visits.   Contact information:   [address] [place] [address] [phone]        Signed: Pascal Lux 09/15/2014, 8:02 AM

## 2014-09-16 NOTE — Anesthesia Postprocedure Evaluation (Signed)
  Anesthesia Post-op Note  Patient: Danielle Spears  Procedure(s) Performed: Procedure(s): RIGHT TOTAL KNEE ARTHROPLASTY (Right)  Patient Location: PACU  Anesthesia Type:GA combined with regional for post-op pain  Level of Consciousness: awake, alert  and oriented  Airway and Oxygen Therapy: Patient Spontanous Breathing  Post-op Pain: mild  Post-op Assessment: Post-op Vital signs reviewed   LLE Sensation: Full sensation, No numbness, No tingling   RLE Sensation: Full sensation, No numbness, No tingling, Pain L Sensory Level: S1-Sole of foot, small toes R Sensory Level: S1-Sole of foot, small toes  Post-op Vital Signs: Reviewed  Last Vitals:  Filed Vitals:   09/15/14 1300  BP: 123/71  Pulse: 62  Temp: 36.6 C  Resp: 18    Complications: No apparent anesthesia complications

## 2014-09-21 ENCOUNTER — Telehealth: Payer: Self-pay | Admitting: Family Medicine

## 2014-09-21 NOTE — Telephone Encounter (Signed)
Pt is requesting a refill on her alprazolam 0.5 mg

## 2014-09-21 NOTE — Telephone Encounter (Signed)
ERROR

## 2014-10-11 ENCOUNTER — Emergency Department (HOSPITAL_COMMUNITY)
Admission: EM | Admit: 2014-10-11 | Discharge: 2014-10-12 | Disposition: A | Discharge status: Home / Self Care | Payer: Managed Care, Other (non HMO) | Attending: Emergency Medicine | Admitting: Emergency Medicine

## 2014-10-11 ENCOUNTER — Encounter (HOSPITAL_COMMUNITY): Payer: Self-pay | Admitting: Emergency Medicine

## 2014-10-11 DIAGNOSIS — M79604 Pain in right leg: Secondary | ICD-10-CM | POA: Diagnosis not present

## 2014-10-11 DIAGNOSIS — Z79899 Other long term (current) drug therapy: Secondary | ICD-10-CM | POA: Insufficient documentation

## 2014-10-11 DIAGNOSIS — J45909 Unspecified asthma, uncomplicated: Secondary | ICD-10-CM | POA: Diagnosis not present

## 2014-10-11 DIAGNOSIS — M25461 Effusion, right knee: Secondary | ICD-10-CM

## 2014-10-11 DIAGNOSIS — R2241 Localized swelling, mass and lump, right lower limb: Secondary | ICD-10-CM | POA: Insufficient documentation

## 2014-10-11 DIAGNOSIS — Z87891 Personal history of nicotine dependence: Secondary | ICD-10-CM | POA: Diagnosis not present

## 2014-10-11 LAB — CBC
HEMATOCRIT: 37.7 % (ref 36.0–46.0)
HEMOGLOBIN: 12.8 g/dL (ref 12.0–15.0)
MCH: 30.3 pg (ref 26.0–34.0)
MCHC: 34 g/dL (ref 30.0–36.0)
MCV: 89.1 fL (ref 78.0–100.0)
Platelets: 243 10*3/uL (ref 150–400)
RBC: 4.23 MIL/uL (ref 3.87–5.11)
RDW: 12.6 % (ref 11.5–15.5)
WBC: 4.3 10*3/uL (ref 4.0–10.5)

## 2014-10-11 LAB — BASIC METABOLIC PANEL
ANION GAP: 9 (ref 5–15)
BUN: 7 mg/dL (ref 6–20)
CHLORIDE: 102 mmol/L (ref 101–111)
CO2: 26 mmol/L (ref 22–32)
Calcium: 9.5 mg/dL (ref 8.9–10.3)
Creatinine, Ser: 0.79 mg/dL (ref 0.44–1.00)
GFR calc Af Amer: >60 mL/min (ref >60)
GFR calc non Af Amer: >60 mL/min (ref >60)
GLUCOSE: 105 mg/dL — AB (ref 65–99)
POTASSIUM: 3.7 mmol/L (ref 3.5–5.1)
SODIUM: 137 mmol/L (ref 135–145)

## 2014-10-11 LAB — URINALYSIS, ROUTINE W REFLEX MICROSCOPIC
BILIRUBIN URINE: NEGATIVE
Glucose, UA: NEGATIVE mg/dL
Hgb urine dipstick: NEGATIVE
Ketones, ur: NEGATIVE mg/dL
NITRITE: NEGATIVE
PH: 5.5 (ref 5.0–8.0)
Protein, ur: NEGATIVE mg/dL
SPECIFIC GRAVITY, URINE: 1.012 (ref 1.005–1.030)
UROBILINOGEN UA: 0.2 mg/dL (ref 0.0–1.0)

## 2014-10-11 LAB — URINE MICROSCOPIC-ADD ON

## 2014-10-11 MED ORDER — OXYCODONE-ACETAMINOPHEN 5-325 MG PO TABS
1.0000 | ORAL_TABLET | Freq: Once | ORAL | Status: AC
  Administered 2014-10-11: 1 via ORAL

## 2014-10-11 MED ORDER — OXYCODONE-ACETAMINOPHEN 5-325 MG PO TABS
ORAL_TABLET | ORAL | Status: AC
  Administered 2014-10-11: 1
  Filled 2014-10-11: qty 1

## 2014-10-11 MED ORDER — CEPHALEXIN 500 MG PO CAPS: 500.0000 mg | ORAL_CAPSULE | Freq: Four times a day (QID) | ORAL | Status: DC

## 2014-10-11 MED ORDER — APIXABAN 2.5 MG PO TABS: 2.5000 mg | ORAL_TABLET | Freq: Two times a day (BID) | ORAL | Status: DC

## 2014-10-11 MED ORDER — ENOXAPARIN SODIUM 100 MG/ML ~~LOC~~ SOLN
1.0000 mg/kg | Freq: Once | SUBCUTANEOUS | Status: DC
  Filled 2014-10-11: qty 1

## 2014-10-11 MED ORDER — CEPHALEXIN 250 MG PO CAPS
500.0000 mg | ORAL_CAPSULE | Freq: Once | ORAL | Status: AC
  Administered 2014-10-11: 500 mg via ORAL
  Filled 2014-10-11: qty 2

## 2014-10-11 MED ORDER — ENOXAPARIN SODIUM 100 MG/ML ~~LOC~~ SOLN
80.0000 mg | Freq: Once | SUBCUTANEOUS | Status: AC
  Administered 2014-10-12: 80 mg via SUBCUTANEOUS
  Filled 2014-10-11: qty 1

## 2014-10-11 NOTE — ED Provider Notes (Signed)
CSN: [id number]     Arrival date & time 10/11/14  1858 History   First MD Initiated Contact with Patient 10/11/14 2246     Chief Complaint  Patient presents with  . Leg Swelling     (Consider location/radiation/quality/duration/timing/severity/associated sxs/prior Treatment) HPI Danielle Spears is a 59 y.o. female presents to emergency department complaining of right shin pain. Patient states she had a right knee arthroplasty on 09/13/14. Patient states that since then she has had swelling and pain to the knee joint. She states today she noticed pain to the right proximal lateral shin. States pain is sharp, shooting. Comes and goes. Worse with walking and palpation of the leg. He denies any worsening swelling in her knee. She denies any fever but admits to chills. She continues to have some pain with range of motion of the knee. She denies any numbness or weakness distal to the knee. She denies any pain to the medial calf. She denies any swelling to the ankle or foot. She reports some bruising to the calf and the foot from the surgery. She states she was on Elequis was postsurgery was taken off approximately 10 days ago. No Hx of DVTs   Past Medical History  Diagnosis Date  . Asthma   . Seasonal allergies   . Primary localized osteoarthritis of right knee   . Complication of anesthesia     Bradycardia to 40's during knee surgery 2014; received atropine 0.1mg  x2, HR increased to 50's; no further brady episodes   Past Surgical History  Procedure Laterality Date  . Cesarean section    . 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 Knee arthroscopy with medial menisectomy Right 05/02/2012    Procedure: KNEE ARTHROSCOPY WITH PARTIAL MEDIAL AND LATERAL MENISECTOMY AND PATELLA-FEMORAL  CHONDROPLASTY;  Surgeon: Harvie Junior, MD;  Location: [facility];  Service: Orthopedics;  Laterality: Right;  . Esophagogastroduodenoscopy  2004    Dr. Karilyn Cota: empiric dilation, gastric  and pyloric channel inflammation  . Esophagogastroduodenoscopy N/A 10/26/2013    Dr. Rourk:Distal esophageal erosions consistent with mild erosive refluxesophagitis. Status post passage of a Maloney dilator followed by esophageal biopsy. stomach biopsy with increase of intraepthelial eosinophils, esophagus without eosinophilic esophagitis  . Maloney dilation N/A 10/26/2013    Procedure: Elease Hashimoto DILATION;  Surgeon: Corbin Ade, MD;  Location: [facility];  Service: Endoscopy;  Laterality: N/A;  . Cholecystectomy N/A 01/27/2014    Procedure: LAPAROSCOPIC CHOLECYSTECTOMY;  Surgeon: Dalia Heading, MD;  Location: [facility];  Service: General;  Laterality: N/A;  . Varicose vein    . Diagnostic laparoscopy    . Multiple tooth extractions    . Tubal ligation    . Total knee arthroplasty Right 09/13/2014    Procedure: RIGHT TOTAL KNEE ARTHROPLASTY;  Surgeon: Salvatore Marvel, MD;  Location: [facility] OR;  Service: Orthopedics;  Laterality: Right;   Family History  Problem Relation Age of Onset  . Diabetes Father   . Heart attack Father   . Cancer Father     prostate  . Heart disease Father   . Colon cancer Neg Hx   . Parkinson's disease Mother   . Arthritis Mother    Social History  Substance Use Topics  . Smoking status: Former Smoker -- 0.25 packs/day for 5 years    Quit date: 05/01/1999  . Smokeless tobacco: Never Used  . Alcohol Use: No   OB History    No data available  Review of Systems  Constitutional: Positive for chills. Negative for fever.  Musculoskeletal: Positive for myalgias, joint swelling and arthralgias.  Neurological: Negative for weakness and numbness.      Allergies  Review of patient's allergies indicates no known allergies.  Home Medications   Prior to Admission medications   Medication Sig Start Date End Date Taking? Authorizing Provider  ADVAIR DISKUS 250-50 MCG/DOSE AEPB USE 1 INHALATION ORALLY EVERY 12 HOURS 09/13/14  Yes Babs Sciara, MD  albuterol  (PROVENTIL HFA;VENTOLIN HFA) 108 (90 BASE) MCG/ACT inhaler Inhale 2 puffs into the lungs every 4 (four) hours as needed for wheezing or shortness of breath.   Yes Historical Provider, MD  ALPRAZolam (XANAX) 0.5 MG tablet TAKE ONE-HALF TO ONE TABLET BY MOUTH THREE TIMES DAILY AS NEEDED FOR ANXIOUSNESS 08/24/14  Yes Babs Sciara, MD  b complex vitamins tablet Take 1 tablet by mouth daily.   Yes Historical Provider, MD  cetirizine (ZYRTEC) 10 MG tablet Take 10 mg by mouth daily.   Yes Historical Provider, MD  Cholecalciferol (VITAMIN D3) 1000 UNITS CAPS Take 1,000 Units by mouth daily.   Yes Historical Provider, MD  docusate sodium (COLACE) 100 MG capsule 1 tab 2 times a day while on narcotics.  STOOL SOFTENER 09/15/14  Yes Kirstin Shepperson, PA-C  Multiple Vitamins-Minerals (MULTIVITAMIN WITH MINERALS) tablet Take 1 tablet by mouth daily.   Yes Historical Provider, MD  oxyCODONE (OXY IR/ROXICODONE) 5 MG immediate release tablet Take 1-2 tablets (5-10 mg total) by mouth every 4 (four) hours as needed for breakthrough pain. 09/15/14  Yes Kirstin Shepperson, PA-C  polyethylene glycol (MIRALAX / GLYCOLAX) packet 17grams in 16 oz of water twice a day until bowel movement.  LAXITIVE.  Restart if two days since last bowel movement 09/15/14  Yes Kirstin Shepperson, PA-C   BP 143/76 mmHg  Pulse 57  Temp(Src) 98.3 F (36.8 C) (Oral)  Resp 13  SpO2 100% Physical Exam  Constitutional: She is oriented to person, place, and time. She appears well-developed and well-nourished. No distress.  HENT:  Head: Normocephalic.  Eyes: Conjunctivae are normal.  Neck: Neck supple.  Cardiovascular: Normal rate, regular rhythm and normal heart sounds.   Pulmonary/Chest: Effort normal and breath sounds normal. No respiratory distress. She has no wheezes. She has no rales.  Abdominal: Soft. Bowel sounds are normal. She exhibits no distension. There is no tenderness. There is no rebound.  Musculoskeletal: She exhibits no  edema.  Surgical incision to the right knee, healing well, no erythema around the edges, swelling, drainage. Right knee joint is swollen, there is mild erythema surrounding the joint extending to the lateral shin. It is warm to touch. Limited range of motion of the right knee, flexion up to 90. Full extension. Tenderness to palpation over lateral proximal shin. No obvious swelling or bruising noted. Pain with dorsiflexion right ankle. Full ROM of the ankle. Dorsal pedal pulses intact  Neurological: She is alert and oriented to person, place, and time.  Skin: Skin is warm and dry.  Psychiatric: She has a normal mood and affect. Her behavior is normal.  Nursing note and vitals reviewed.   ED Course  Procedures (including critical care time) Labs Review Labs Reviewed  BASIC METABOLIC PANEL - Abnormal; Notable for the following:    Glucose, Bld 105 (*)    All other components within normal limits  URINALYSIS, ROUTINE W REFLEX MICROSCOPIC (NOT AT [facility]) - Abnormal; Notable for the following:    Leukocytes, UA MODERATE (*)  All other components within normal limits  CBC  URINE MICROSCOPIC-ADD ON    Imaging Review No results found. I have personally reviewed and evaluated these images and lab results as part of my medical decision-making.   EKG Interpretation   Date/Time:  Monday October 11 2014 19:37:25 EDT Ventricular Rate:  63 PR Interval:  176 QRS Duration: 64 QT Interval:  418 QTC Calculation: 427 R Axis:   70 Text Interpretation:  Normal sinus rhythm Nonspecific ST abnormality  Abnormal ECG No old tracing to compare Confirmed by Mirian Mo  [id number]) on 10/11/2014 10:53:57 PM      MDM   Final diagnoses:  Right leg pain  Knee swelling, right   Patient with pain to the right lateral shin, continues to have pain and swelling to the knee, although she states is not worse than it has been, her knee joint is swollen, slightly warm to the touch, with mild skin erythema.  There is no abnormality to the incision. Will speak with pt's orthopedist.   Spoke with Dr. Eulah Pont. Discussed symptoms. Asked to restart eliquis, keflex for possible cellulitis/joint infection. They will get her in the office within next few days for recheck. Pt agrees to the test. lovenox given prior to dc, keflex first dose in ED. Prescriptions given.   Filed Vitals:   10/11/14 2317 10/11/14 2320 10/11/14 2330 10/12/14 0013  BP: 143/76  143/74 140/86  Pulse: 57 57 56 62  Temp:    98 F (36.7 C)  TempSrc:    Oral  Resp:  Weight:   182 lb 1.6 oz (82.6 kg)   SpO2: 99% 100% 98% 98%     Jaynie Crumble, PA-C 10/12/14 0039  Mirian Mo, MD 10/13/14 [phone]

## 2014-10-11 NOTE — Discharge Instructions (Signed)
Start eliquis as prescribed. Keflex for possible infection. Keep leg elevated when at home. Follow up with Dr. Thurston Hole in the office tomorrow.

## 2014-10-11 NOTE — ED Notes (Signed)
Pt laid back in chair, and coached with breahing,. bp rechecked and 147/77 at this time.

## 2014-10-11 NOTE — ED Notes (Signed)
Pt from home for eval of sudden onset of right leg pain that started today, pt with recent right knee replacement surgery on 09/13/14. Pt states recently was taken off her blood thinner and has been doing great until today. Pt states she also has been having increased swelling and reddness to leg. Incision shows no sign of infection, no drainage noted.

## 2014-10-11 NOTE — ED Notes (Signed)
PA at bedside.

## 2014-10-11 NOTE — ED Notes (Addendum)
During triage, pt states  feels like she is going to pass out. Pt placed back on monitor, VSS 100% pulse ox, HR 88, BP 99/57. Pt just had blood drawn by EMT and states she has not eaten today. Pt pale in the face.

## 2014-10-12 ENCOUNTER — Ambulatory Visit (HOSPITAL_BASED_OUTPATIENT_CLINIC_OR_DEPARTMENT_OTHER)
Admission: RE | Admit: 2014-10-12 | Discharge: 2014-10-12 | Disposition: A | Discharge status: Home / Self Care | Payer: Managed Care, Other (non HMO) | Source: Ambulatory Visit | Attending: Cardiovascular Disease | Admitting: Cardiovascular Disease

## 2014-10-12 ENCOUNTER — Other Ambulatory Visit (HOSPITAL_COMMUNITY): Payer: Self-pay | Admitting: Orthopedic Surgery

## 2014-10-12 DIAGNOSIS — M25561 Pain in right knee: Secondary | ICD-10-CM | POA: Diagnosis not present

## 2014-12-02 ENCOUNTER — Ambulatory Visit (INDEPENDENT_AMBULATORY_CARE_PROVIDER_SITE_OTHER): Payer: Managed Care, Other (non HMO)

## 2014-12-02 DIAGNOSIS — Z23 Encounter for immunization: Secondary | ICD-10-CM

## 2015-03-11 ENCOUNTER — Other Ambulatory Visit: Payer: Self-pay | Admitting: Family Medicine

## 2015-03-18 ENCOUNTER — Ambulatory Visit (INDEPENDENT_AMBULATORY_CARE_PROVIDER_SITE_OTHER): Payer: Managed Care, Other (non HMO) | Admitting: Family Medicine

## 2015-03-18 ENCOUNTER — Encounter: Payer: Self-pay | Admitting: Family Medicine

## 2015-03-18 VITALS — BP 120/80 | Temp 98.2°F | Ht 62.5 in | Wt 185.2 lb

## 2015-03-18 DIAGNOSIS — J329 Chronic sinusitis, unspecified: Secondary | ICD-10-CM | POA: Diagnosis not present

## 2015-03-18 MED ORDER — AMOXICILLIN-POT CLAVULANATE 875-125 MG PO TABS: 1.0000 | ORAL_TABLET | Freq: Two times a day (BID) | ORAL | Status: AC

## 2015-03-18 NOTE — Progress Notes (Signed)
   Subjective:    Patient ID: Danielle Spears, female    DOB: [birth year], 60 y.o.   MRN: [id number]  Sinusitis This is a new problem. The current episode started 1 to 4 weeks ago. Associated symptoms include chills, ear pain, headaches, neck pain, sinus pressure and sneezing. (Runny nose) Past treatments include nothing.  started with flu prodrome  Dim enregy and achey  Now headache   Frontal pressure  Max sinus tebd abd fribtal  Non smoker    No sig fever   Dim eneergy   Patient states no other concerns this visit.  Review of Systems  Constitutional: Positive for chills.  HENT: Positive for ear pain, sinus pressure and sneezing.   Musculoskeletal: Positive for neck pain.  Neurological: Positive for headaches.       Objective:   Physical Exam Alert, mild malaise. Hydration good Vitals stable. frontal/ maxillary tenderness evident positive nasal congestion. pharynx normal neck supple  lungs clear/no crackles or wheezes. heart regular in rhythm        Assessment & Plan:  Impression rhinosinusitis likely post viral, discussed with patient. plan antibiotics prescribed. Questions answered. Symptomatic care discussed. warning signs discussed. WSL

## 2015-03-21 ENCOUNTER — Telehealth: Payer: Self-pay | Admitting: Family Medicine

## 2015-03-21 MED ORDER — FLUTICASONE-SALMETEROL 250-50 MCG/DOSE IN AEPB: INHALATION_SPRAY | RESPIRATORY_TRACT | Status: DC

## 2015-03-21 NOTE — Telephone Encounter (Signed)
Patient called and wanted to know why she only had one ADVAIR DISKUS 250-50 MCG/DOSE AEPB called in and no refills.

## 2015-03-21 NOTE — Telephone Encounter (Signed)
Discussed with patient. Med sent to pharmacy

## 2015-03-30 ENCOUNTER — Telehealth: Payer: Self-pay | Admitting: Family Medicine

## 2015-03-30 MED ORDER — AZITHROMYCIN 250 MG PO TABS: ORAL_TABLET | ORAL | Status: DC

## 2015-03-30 NOTE — Telephone Encounter (Signed)
Discussed patient symptoms with Dr.Scott Luking in real time. Dr.Scott Luking gave verbal orders for a Zpak to be sent into pharmacy for patient. Patient notified and advised to call back for office visit if no better after completing Zpak. Patient verbalized understanding.

## 2015-03-30 NOTE — Telephone Encounter (Signed)
Pt seen by Dr Brett CanalesSteve on the 24th of Feb Feels the antibiotic issued did not really work She feels she needs another round of antibiotic to help  Move the congestion out of her chest, has cough now, headache Sinus pressure and burn.   [facility]   zpak if you can she feels this would be best

## 2015-04-04 ENCOUNTER — Encounter: Payer: Self-pay | Admitting: Family Medicine

## 2015-04-04 ENCOUNTER — Ambulatory Visit (INDEPENDENT_AMBULATORY_CARE_PROVIDER_SITE_OTHER): Payer: Managed Care, Other (non HMO) | Admitting: Family Medicine

## 2015-04-04 VITALS — BP 126/88 | Temp 98.3°F | Ht 62.5 in | Wt 180.0 lb

## 2015-04-04 DIAGNOSIS — B9689 Other specified bacterial agents as the cause of diseases classified elsewhere: Secondary | ICD-10-CM

## 2015-04-04 DIAGNOSIS — J019 Acute sinusitis, unspecified: Secondary | ICD-10-CM | POA: Diagnosis not present

## 2015-04-04 MED ORDER — LEVOFLOXACIN 500 MG PO TABS: 500.0000 mg | ORAL_TABLET | Freq: Every day | ORAL | Status: DC

## 2015-04-04 NOTE — Progress Notes (Signed)
   Subjective:    Patient ID: Danielle Spears, female    DOB: 1955/03/22, 60 y.o.   MRN: [id number]  Cough This is a recurrent problem. The current episode started 1 to 4 weeks ago. The problem occurs every few minutes. The cough is productive of sputum. Associated symptoms include chest pain, headaches, myalgias, rhinorrhea and shortness of breath. Associated symptoms comments: Chest congestion, Nasal burning.. Treatments tried: Zpak,Augmentin.    viral syndrome secondary rhinosinusitis probable.   Review of Systems  HENT: Positive for rhinorrhea.   Respiratory: Positive for cough and shortness of breath.   Cardiovascular: Positive for chest pain.  Musculoskeletal: Positive for myalgias.  Neurological: Positive for headaches.       Objective:   Physical Exam   moderate sinus tenderness throat normal neck supple lungs clear heart regular      Assessment & Plan:   viral syndrome Secondary rhinosinusitis Antibiotics prescribed warning signs discussed , will go ahead with Levaquin 10 days

## 2015-04-05 ENCOUNTER — Other Ambulatory Visit: Payer: Self-pay | Admitting: Family Medicine

## 2015-04-14 ENCOUNTER — Telehealth: Payer: Self-pay | Admitting: Family Medicine

## 2015-04-14 ENCOUNTER — Other Ambulatory Visit: Payer: Self-pay | Admitting: *Deleted

## 2015-04-14 MED ORDER — CLARITHROMYCIN 500 MG PO TABS: 500.0000 mg | ORAL_TABLET | Freq: Two times a day (BID) | ORAL | Status: DC

## 2015-04-14 NOTE — Telephone Encounter (Signed)
Pt was seem for acute bacterial rhinitis on 3/13 Pt would like to know if she needs another round of antibiotic She does not want this to come back on her again  She is still having nose burning, coughing (more at night) drainage and chest still has discomfort   [facility]

## 2015-04-14 NOTE — Telephone Encounter (Signed)
Nurse's-please talk with the patient discussed with her specifically what she means by pain in the center of the chest. Obviously this can means so many things.(Characterized the pain? Is it all day long? Heaviness with activity? Burning cough soreness sharp pain?)

## 2015-04-14 NOTE — Telephone Encounter (Signed)
Pt states she had same chest pain when seen except it is better now it was in the center and on both sides. Now just in center. Not really pain just feels like congestion in chest. No burning with cough, pain is worse with cough, some heaviness but not with activity. Just feels like congestion that wont come up when coughing.

## 2015-04-14 NOTE — Telephone Encounter (Signed)
I would recommend trying Biaxin 500 mg 1 twice a day with snack twice daily for 10 days if ongoing trouble please notify us if worse will need x-rays an office visit

## 2015-04-14 NOTE — Telephone Encounter (Signed)
Discussed with pt. Pt verbalized understanding. Med sent to pharm.  

## 2015-04-14 NOTE — Telephone Encounter (Signed)
Pt finished levaquin and feels 75% better. Still having chest pain in the center of chest. Better than what it was at office visit. No sob, no wheezing, burning in nose, drainage in throat, dry cough, no fever.

## 2015-05-30 ENCOUNTER — Telehealth: Payer: Self-pay | Admitting: Family Medicine

## 2015-05-30 MED ORDER — FLUTICASONE-SALMETEROL 250-50 MCG/DOSE IN AEPB: INHALATION_SPRAY | RESPIRATORY_TRACT | Status: DC

## 2015-05-30 NOTE — Telephone Encounter (Signed)
Patient says she was told the last time she got her Advair filled that she would need an OV.  She says she has already been seen 2 times in 2017 for rhinosinusitis.  She wants to know if an appointment will still be necessary.  Please advise.

## 2015-05-30 NOTE — Telephone Encounter (Signed)
Both visits in 2017 were acute sickness for sinus infection

## 2015-05-30 NOTE — Telephone Encounter (Addendum)
Rx sent electronically to pharmacy. Patient notified she will need office visit in fall. Patient verbalized understanding.

## 2015-05-30 NOTE — Telephone Encounter (Signed)
She may have 6 refills follow-up by fall time

## 2015-06-02 ENCOUNTER — Other Ambulatory Visit: Payer: Self-pay | Admitting: Family Medicine

## 2015-07-01 ENCOUNTER — Ambulatory Visit (HOSPITAL_COMMUNITY)
Admission: RE | Admit: 2015-07-01 | Discharge: 2015-07-01 | Disposition: A | Discharge status: Home / Self Care | Payer: Managed Care, Other (non HMO) | Source: Ambulatory Visit | Attending: Nurse Practitioner | Admitting: Nurse Practitioner

## 2015-07-01 ENCOUNTER — Ambulatory Visit (HOSPITAL_COMMUNITY): Admission: RE | Admit: 2015-07-01 | Payer: Managed Care, Other (non HMO) | Source: Ambulatory Visit

## 2015-07-01 ENCOUNTER — Other Ambulatory Visit: Payer: Self-pay | Admitting: Nurse Practitioner

## 2015-07-01 ENCOUNTER — Encounter: Payer: Self-pay | Admitting: Family Medicine

## 2015-07-01 ENCOUNTER — Ambulatory Visit (INDEPENDENT_AMBULATORY_CARE_PROVIDER_SITE_OTHER): Payer: Managed Care, Other (non HMO) | Admitting: Family Medicine

## 2015-07-01 VITALS — BP 124/88 | Ht 62.5 in | Wt 190.5 lb

## 2015-07-01 DIAGNOSIS — J4520 Mild intermittent asthma, uncomplicated: Secondary | ICD-10-CM | POA: Diagnosis not present

## 2015-07-01 DIAGNOSIS — Z1231 Encounter for screening mammogram for malignant neoplasm of breast: Secondary | ICD-10-CM | POA: Insufficient documentation

## 2015-07-01 DIAGNOSIS — B9689 Other specified bacterial agents as the cause of diseases classified elsewhere: Secondary | ICD-10-CM

## 2015-07-01 DIAGNOSIS — J019 Acute sinusitis, unspecified: Secondary | ICD-10-CM | POA: Diagnosis not present

## 2015-07-01 MED ORDER — AMOXICILLIN-POT CLAVULANATE 875-125 MG PO TABS: 1.0000 | ORAL_TABLET | Freq: Two times a day (BID) | ORAL | Status: AC

## 2015-07-01 MED ORDER — PANTOPRAZOLE SODIUM 40 MG PO TBEC: 40.0000 mg | DELAYED_RELEASE_TABLET | Freq: Every day | ORAL | Status: AC

## 2015-07-01 NOTE — Progress Notes (Signed)
   Subjective:    Patient ID: Danielle Spears, female    DOB: [birth year], 60 y.o.   MRN: [id number]  Asthma She complains of cough. There is no shortness of breath or wheezing. This is a chronic problem. The current episode started more than 1 year ago. Associated symptoms include rhinorrhea. Pertinent negatives include no chest pain, ear pain or fever. Her past medical history is significant for asthma.   Patient also has c/o sore throat, nasal congestion with green/yellow mucus.  Patient with head congestion sinus pressure drainage coughing not feeling good of the past week and a half  Review of Systems  Constitutional: Negative for fever and activity change.  HENT: Positive for congestion and rhinorrhea. Negative for ear pain.   Eyes: Negative for discharge.  Respiratory: Positive for cough. Negative for shortness of breath and wheezing.   Cardiovascular: Negative for chest pain.       Objective:   Physical Exam  Constitutional: She appears well-developed.  HENT:  Head: Normocephalic.  Nose: Nose normal.  Mouth/Throat: Oropharynx is clear and moist. No oropharyngeal exudate.  Neck: Neck supple.  Cardiovascular: Normal rate and normal heart sounds.   No murmur heard. Pulmonary/Chest: Effort normal and breath sounds normal. She has no wheezes.  Lymphadenopathy:    She has no cervical adenopathy.  Skin: Skin is warm and dry.  Nursing note and vitals reviewed.         Assessment & Plan:  Asthma related issues no need for prednisone currently continue current inhalers  Viral upper rest real illness Sinusitis Antibodies prescribed warning signs discussed Follow-up if problems

## 2015-07-19 ENCOUNTER — Ambulatory Visit: Payer: Managed Care, Other (non HMO) | Admitting: Family Medicine

## 2015-08-05 ENCOUNTER — Other Ambulatory Visit: Payer: Self-pay | Admitting: Family Medicine

## 2015-08-09 IMAGING — NM NM HEPATO W/GB/PHARM/[PERSON_NAME]
2 series · 12 of 12 positions shown · non-contrast
Comparison: None.

CLINICAL DATA: Five month history of right upper quadrant pain,
worse with eating

EXAM:
NUCLEAR MEDICINE HEPATOBILIARY IMAGING WITH GALLBLADDER EF
Views: Anterior right upper quadrant
Radionuclide:  Technetium 99m mebrofenin
Dose:  5.0 mCi
Route of administration: Intravenous

[Series 1: biliary · 3.25mm/px · 6 of 60 frames shown]
[frame 6/60]
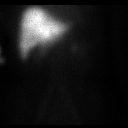
[frame 16/60]
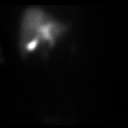
[frame 26/60]
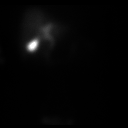
[frame 36/60]
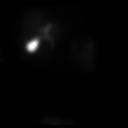
[frame 46/60]
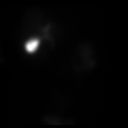
[frame 56/60]
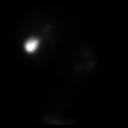

[Series 2: gbef · 3.25mm/px · 6 of 30 frames shown]
[frame 3/30]
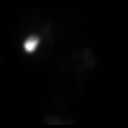
[frame 8/30]
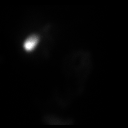
[frame 13/30]
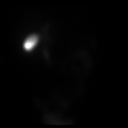
[frame 18/30]
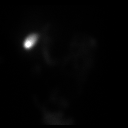
[frame 23/30]
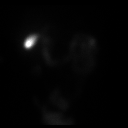
[frame 28/30]
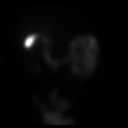

[12 of 12 positions shown; findings below may reference images not displayed]

FINDINGS: Liver uptake of radiotracer is normal. There is prompt visualization
of gallbladder and small bowel, indicating patency of the cystic and
common bile ducts. A weight based dose, 1.48 mcg, of CCK was
administered intravenously with calculation of the computer
generated ejection fraction of radiotracer from the gallbladder. The
patient did experience pain and nausea with CCK administration. The
computer generated ejection fraction of radiotracer from the
gallbladder is normal at 65%, normal greater than 38%.
IMPRESSION: Normal ejection fraction of radiotracer from the gallbladder. Note
that the patient did experience pain and nausea with the CCK
administration. Cystic and common bile ducts are patent as is
evidenced by visualization of gallbladder and small bowel.

## 2015-10-26 ENCOUNTER — Telehealth: Payer: Self-pay | Admitting: Family Medicine

## 2015-10-26 MED ORDER — FLUTICASONE-SALMETEROL 250-50 MCG/DOSE IN AEPB: INHALATION_SPRAY | RESPIRATORY_TRACT | 1 refills | Status: DC

## 2015-10-26 NOTE — Telephone Encounter (Signed)
Patient needs Rx for her Advair inhaler to her new pharmacy, [facility].

## 2015-10-26 NOTE — Telephone Encounter (Signed)
Notified patient that med was sent to pharmacy.  

## 2015-12-13 ENCOUNTER — Other Ambulatory Visit: Payer: Self-pay | Admitting: Family Medicine

## 2016-03-26 ENCOUNTER — Telehealth: Payer: Self-pay | Admitting: Family Medicine

## 2016-03-26 ENCOUNTER — Other Ambulatory Visit: Payer: Self-pay | Admitting: *Deleted

## 2016-03-26 MED ORDER — FLUTICASONE-SALMETEROL 250-50 MCG/DOSE IN AEPB: INHALATION_SPRAY | RESPIRATORY_TRACT | 5 refills | Status: AC

## 2016-03-26 NOTE — Telephone Encounter (Signed)
Patient is requesting new Rx for her Advair to her new pharmacy [facility].

## 2016-03-26 NOTE — Telephone Encounter (Signed)
Spoke with patient and informed her that refills were sent into pharmacy. Patient verbalized understanding. 

## 2016-03-29 ENCOUNTER — Other Ambulatory Visit: Payer: Self-pay | Admitting: *Deleted

## 2016-03-29 MED ORDER — FLUTICASONE-SALMETEROL 250-50 MCG/DOSE IN AEPB: INHALATION_SPRAY | RESPIRATORY_TRACT | 0 refills | Status: AC

## 2016-07-12 ENCOUNTER — Other Ambulatory Visit: Payer: Self-pay | Admitting: Family Medicine

## 2022-02-12 ENCOUNTER — Encounter: Payer: Self-pay | Admitting: *Deleted
